# Patient Record
Sex: Female | Born: 1980 | Race: Black or African American | Hispanic: No | Marital: Married | Smoking: Never smoker
Health system: Southern US, Community
[De-identification: ages and names within clinical notes are randomized; demographics above are authoritative.]

## PROBLEM LIST (undated history)

## (undated) DIAGNOSIS — D509 Iron deficiency anemia, unspecified: Secondary | ICD-10-CM

## (undated) DIAGNOSIS — G43909 Migraine, unspecified, not intractable, without status migrainosus: Secondary | ICD-10-CM

## (undated) DIAGNOSIS — N200 Calculus of kidney: Secondary | ICD-10-CM

## (undated) DIAGNOSIS — K859 Acute pancreatitis without necrosis or infection, unspecified: Secondary | ICD-10-CM

## (undated) DIAGNOSIS — K219 Gastro-esophageal reflux disease without esophagitis: Secondary | ICD-10-CM

## (undated) DIAGNOSIS — O4400 Placenta previa specified as without hemorrhage, unspecified trimester: Secondary | ICD-10-CM

## (undated) DIAGNOSIS — F419 Anxiety disorder, unspecified: Secondary | ICD-10-CM

## (undated) DIAGNOSIS — O4590 Premature separation of placenta, unspecified, unspecified trimester: Secondary | ICD-10-CM

## (undated) DIAGNOSIS — E559 Vitamin D deficiency, unspecified: Secondary | ICD-10-CM

## (undated) DIAGNOSIS — Z5189 Encounter for other specified aftercare: Secondary | ICD-10-CM

## (undated) DIAGNOSIS — F32A Depression, unspecified: Secondary | ICD-10-CM

## (undated) DIAGNOSIS — D6859 Other primary thrombophilia: Secondary | ICD-10-CM

## (undated) DIAGNOSIS — K589 Irritable bowel syndrome without diarrhea: Secondary | ICD-10-CM

## (undated) HISTORY — DX: Migraine, unspecified, not intractable, without status migrainosus: G43.909

## (undated) HISTORY — DX: Anxiety disorder, unspecified: F41.9

## (undated) HISTORY — DX: Encounter for other specified aftercare: Z51.89

## (undated) HISTORY — DX: Iron deficiency anemia, unspecified: D50.9

## (undated) HISTORY — DX: Complete placenta previa nos or without hemorrhage, unspecified trimester: O44.00

## (undated) HISTORY — DX: Depression, unspecified: F32.A

## (undated) HISTORY — DX: Vitamin D deficiency, unspecified: E55.9

## (undated) HISTORY — DX: Calculus of kidney: N20.0

## (undated) HISTORY — DX: Other primary thrombophilia: D68.59

---
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
## 2005-11-01 DIAGNOSIS — D6859 Other primary thrombophilia: Secondary | ICD-10-CM | POA: Insufficient documentation

## 2013-04-16 DIAGNOSIS — Z0184 Encounter for antibody response examination: Secondary | ICD-10-CM

## 2013-04-16 HISTORY — DX: Encounter for antibody response examination: Z01.84

## 2016-06-27 DIAGNOSIS — D509 Iron deficiency anemia, unspecified: Secondary | ICD-10-CM | POA: Insufficient documentation

## 2017-04-03 ENCOUNTER — Encounter (HOSPITAL_COMMUNITY): Payer: Self-pay | Admitting: Family Medicine

## 2017-04-03 DIAGNOSIS — R945 Abnormal results of liver function studies: Secondary | ICD-10-CM | POA: Diagnosis not present

## 2017-04-03 DIAGNOSIS — R1013 Epigastric pain: Secondary | ICD-10-CM | POA: Insufficient documentation

## 2017-04-03 LAB — CBC
HCT: 36.2 % (ref 36.0–46.0)
Hemoglobin: 12 g/dL (ref 12.0–15.0)
MCH: 28 pg (ref 26.0–34.0)
MCHC: 33.1 g/dL (ref 30.0–36.0)
MCV: 84.4 fL (ref 78.0–100.0)
PLATELETS: 290 10*3/uL (ref 150–400)
RBC: 4.29 MIL/uL (ref 3.87–5.11)
RDW: 12.5 % (ref 11.5–15.5)
WBC: 13.2 10*3/uL — AB (ref 4.0–10.5)

## 2017-04-03 LAB — COMPREHENSIVE METABOLIC PANEL
ALT: 69 U/L — AB (ref 14–54)
AST: 129 U/L — AB (ref 15–41)
Albumin: 4.2 g/dL (ref 3.5–5.0)
Alkaline Phosphatase: 73 U/L (ref 38–126)
Anion gap: 6 (ref 5–15)
BILIRUBIN TOTAL: 0.5 mg/dL (ref 0.3–1.2)
BUN: 20 mg/dL (ref 6–20)
CO2: 27 mmol/L (ref 22–32)
CREATININE: 0.98 mg/dL (ref 0.44–1.00)
Calcium: 8.9 mg/dL (ref 8.9–10.3)
Chloride: 106 mmol/L (ref 101–111)
Glucose, Bld: 178 mg/dL — ABNORMAL HIGH (ref 65–99)
POTASSIUM: 3.2 mmol/L — AB (ref 3.5–5.1)
Sodium: 139 mmol/L (ref 135–145)
TOTAL PROTEIN: 6.9 g/dL (ref 6.5–8.1)

## 2017-04-03 LAB — LIPASE, BLOOD: LIPASE: 34 U/L (ref 11–51)

## 2017-04-03 MED ORDER — ONDANSETRON 4 MG PO TBDP
4.0000 mg | ORAL_TABLET | Freq: Once | ORAL | Status: AC | PRN
  Administered 2017-04-03: 4 mg via ORAL
  Filled 2017-04-03: qty 1

## 2017-04-03 NOTE — ED Triage Notes (Signed)
Patient is from home and complaining of upper abd pain that started about 45 minutes ago while eating. While enroute, patient had nausea and vomiting. Patient reported to EMS she had a normal BM earlier today.

## 2017-04-04 ENCOUNTER — Emergency Department (HOSPITAL_COMMUNITY): Payer: BLUE CROSS/BLUE SHIELD

## 2017-04-04 ENCOUNTER — Encounter (HOSPITAL_COMMUNITY): Payer: Self-pay

## 2017-04-04 ENCOUNTER — Emergency Department (HOSPITAL_COMMUNITY)
Admission: EM | Admit: 2017-04-04 | Discharge: 2017-04-04 | Disposition: A | Discharge status: Home / Self Care | Payer: BLUE CROSS/BLUE SHIELD | Attending: Emergency Medicine | Admitting: Emergency Medicine

## 2017-04-04 DIAGNOSIS — R1013 Epigastric pain: Secondary | ICD-10-CM

## 2017-04-04 DIAGNOSIS — R748 Abnormal levels of other serum enzymes: Secondary | ICD-10-CM

## 2017-04-04 HISTORY — DX: Irritable bowel syndrome, unspecified: K58.9

## 2017-04-04 HISTORY — DX: Acute pancreatitis without necrosis or infection, unspecified: K85.90

## 2017-04-04 HISTORY — DX: Gastro-esophageal reflux disease without esophagitis: K21.9

## 2017-04-04 LAB — URINALYSIS, ROUTINE W REFLEX MICROSCOPIC
BILIRUBIN URINE: NEGATIVE
Bacteria, UA: NONE SEEN
GLUCOSE, UA: NEGATIVE mg/dL
KETONES UR: 5 mg/dL — AB
LEUKOCYTES UA: NEGATIVE
Nitrite: NEGATIVE
PH: 5 (ref 5.0–8.0)
Protein, ur: NEGATIVE mg/dL
Specific Gravity, Urine: 1.032 — ABNORMAL HIGH (ref 1.005–1.030)

## 2017-04-04 LAB — POC URINE PREG, ED: PREG TEST UR: NEGATIVE

## 2017-04-04 MED ORDER — SODIUM CHLORIDE 0.9 % IV BOLUS (SEPSIS)
500.0000 mL | Freq: Once | INTRAVENOUS | Status: AC
  Administered 2017-04-04: 500 mL via INTRAVENOUS

## 2017-04-04 MED ORDER — IOPAMIDOL (ISOVUE-300) INJECTION 61%
100.0000 mL | Freq: Once | INTRAVENOUS | Status: AC | PRN
Start: 2017-04-04 — End: 2017-04-04
  Administered 2017-04-04: 100 mL via INTRAVENOUS

## 2017-04-04 MED ORDER — SODIUM CHLORIDE 0.9 % IV BOLUS (SEPSIS)
1000.0000 mL | Freq: Once | INTRAVENOUS | Status: AC
  Administered 2017-04-04: 1000 mL via INTRAVENOUS

## 2017-04-04 MED ORDER — IOPAMIDOL (ISOVUE-300) INJECTION 61%
INTRAVENOUS | Status: AC
  Filled 2017-04-04: qty 100

## 2017-04-04 MED ORDER — OMEPRAZOLE 20 MG PO CPDR: DELAYED_RELEASE_CAPSULE | ORAL | 0 refills | Status: DC

## 2017-04-04 MED ORDER — FENTANYL CITRATE (PF) 100 MCG/2ML IJ SOLN
50.0000 ug | Freq: Once | INTRAMUSCULAR | Status: AC
  Administered 2017-04-04: 50 ug via INTRAVENOUS
  Filled 2017-04-04: qty 2

## 2017-04-04 NOTE — Discharge Instructions (Signed)
Avoid milk products or anything that makes her pain worse. Take the Prilosec until you can be evaluated by your gastroenterologist. Please call his office and tell him he came to the ED and he had some elevation of your liver tests.

## 2017-04-04 NOTE — ED Provider Notes (Signed)
[facility] DEPT Provider Note   CSN: [id number] Arrival date & time: 04/03/17  2116  Time seen 03:30 AM   History   Chief Complaint Chief Complaint  Patient presents with  . Abdominal Pain    HPI Jennifer Summers is a 36 y.o. female.  HPI  patient states she started getting severe upper abdominal pain about 8 PM this evening. She states it is burning but feels different from when she had reflux. She states it's similar to when she had gastritis but worse. The pain does not radiate. She states if she touches her abdomen or she moves it makes the pain worse. Nothing makes it feel better. She had nausea on the way to the ED and was given Zofran which has helped. She states she did take Goody's this morning and then again at 2 PM. She took it for pain in her legs. She states she doesn't normally take it. She states after she got this discomfort she took a gas pill without relief. She states she had abdominal bloating earlier but not now. She denies any fever. She states her mother has a history of IBS. She does report over the past week she changed her diet and she has gone to a low carbohydrate high fat diet.  PCP Dr Inda Castle   Past Medical History:  Diagnosis Date  . Acid reflux   . IBS (irritable bowel syndrome)   . Pancreatitis     There are no active problems to display for this patient.   Past Surgical History:  Procedure Laterality Date  . CESAREAN SECTION     x 4.     OB History    No data available       Home Medications    Prior to Admission medications   Medication Sig Start Date End Date Taking? Authorizing Provider  Multiple Vitamin (MULTIVITAMIN WITH MINERALS) TABS tablet Take 1 tablet by mouth daily.   Yes [provider]  omeprazole (PRILOSEC) 20 MG capsule Take 1 po BID x 2 weeks then once a day 04/04/17   Rolland Porter, MD    Family History History reviewed. No pertinent family history.  Social History Social History  Substance Use Topics    . Smoking status: Never Smoker  . Smokeless tobacco: Never Used  . Alcohol use No  employed   Allergies   Codeine   Review of Systems Review of Systems  All other systems reviewed and are negative.    Physical Exam Updated Vital Signs BP 106/64   Pulse 88   Temp (!) 97.5 F (36.4 C) (Oral)   Resp 18   Ht 5\' 3"  (1.6 m)   Wt 64.9 kg (143 lb)   LMP 03/30/2017   SpO2 98%   BMI 25.33 kg/m   Vital signs normal    Physical Exam  Constitutional: She is oriented to person, place, and time. She appears well-developed and well-nourished.  Non-toxic appearance. She does not appear ill. No distress.  HENT:  Head: Normocephalic and atraumatic.  Right Ear: External ear normal.  Left Ear: External ear normal.  Nose: Nose normal. No mucosal edema or rhinorrhea.  Mouth/Throat: Oropharynx is clear and moist and mucous membranes are normal. No dental abscesses or uvula swelling.  Eyes: Pupils are equal, round, and reactive to light. Conjunctivae and EOM are normal.  Neck: Normal range of motion and full passive range of motion without pain. Neck supple.  Cardiovascular: Normal rate, regular rhythm and normal heart sounds.  Exam  reveals no gallop and no friction rub.   No murmur heard. Pulmonary/Chest: Effort normal and breath sounds normal. No respiratory distress. She has no wheezes. She has no rhonchi. She has no rales. She exhibits no tenderness and no crepitus.  Abdominal: Soft. Normal appearance and bowel sounds are normal. She exhibits no distension. There is no tenderness. There is no rebound and no guarding.  Musculoskeletal: Normal range of motion. She exhibits no edema or tenderness.  Moves all extremities well.   Neurological: She is alert and oriented to person, place, and time. She has normal strength. No cranial nerve deficit.  Skin: Skin is warm, dry and intact. No rash noted. No erythema. No pallor.  Psychiatric: She has a normal mood and affect. Her speech is normal  and behavior is normal. Her mood appears not anxious.  Nursing note and vitals reviewed.    ED Treatments / Results  Labs (all labs ordered are listed, but only abnormal results are displayed) Results for orders placed or performed during the hospital encounter of 04/04/17  Lipase, blood  Result Value Ref Range   Lipase 34 11 - 51 U/L  Comprehensive metabolic panel  Result Value Ref Range   Sodium 139 135 - 145 mmol/L   Potassium 3.2 (L) 3.5 - 5.1 mmol/L   Chloride 106 101 - 111 mmol/L   CO2 27 22 - 32 mmol/L   Glucose, Bld 178 (H) 65 - 99 mg/dL   BUN 20 6 - 20 mg/dL   Creatinine, Ser 0.98 0.44 - 1.00 mg/dL   Calcium 8.9 8.9 - 10.3 mg/dL   Total Protein 6.9 6.5 - 8.1 g/dL   Albumin 4.2 3.5 - 5.0 g/dL   AST 129 (H) 15 - 41 U/L   ALT 69 (H) 14 - 54 U/L   Alkaline Phosphatase 73 38 - 126 U/L   Total Bilirubin 0.5 0.3 - 1.2 mg/dL   GFR calc non Af Amer >60 >60 mL/min   GFR calc Af Amer >60 >60 mL/min   Anion gap 6 5 - 15  CBC  Result Value Ref Range   WBC 13.2 (H) 4.0 - 10.5 K/uL   RBC 4.29 3.87 - 5.11 MIL/uL   Hemoglobin 12.0 12.0 - 15.0 g/dL   HCT 36.2 36.0 - 46.0 %   MCV 84.4 78.0 - 100.0 fL   MCH 28.0 26.0 - 34.0 pg   MCHC 33.1 30.0 - 36.0 g/dL   RDW 12.5 11.5 - 15.5 %   Platelets 290 150 - 400 K/uL  Urinalysis, Routine w reflex microscopic  Result Value Ref Range   Color, Urine YELLOW YELLOW   APPearance CLEAR CLEAR   Specific Gravity, Urine 1.032 (H) 1.005 - 1.030   pH 5.0 5.0 - 8.0   Glucose, UA NEGATIVE NEGATIVE mg/dL   Hgb urine dipstick SMALL (A) NEGATIVE   Bilirubin Urine NEGATIVE NEGATIVE   Ketones, ur 5 (A) NEGATIVE mg/dL   Protein, ur NEGATIVE NEGATIVE mg/dL   Nitrite NEGATIVE NEGATIVE   Leukocytes, UA NEGATIVE NEGATIVE   RBC / HPF 0-5 0 - 5 RBC/hpf   WBC, UA 6-30 0 - 5 WBC/hpf   Bacteria, UA NONE SEEN NONE SEEN   Squamous Epithelial / LPF 0-5 (A) NONE SEEN   Mucus PRESENT   POC urine preg, ED  Result Value Ref Range   Preg Test, Ur NEGATIVE  NEGATIVE   Laboratory interpretation all normal except Leukocytosis, hyperglycemia, mild hypokalemia, elevation of LFTs with normal lipase    EKG  EKG Interpretation None       Radiology Ct Abdomen Pelvis W Contrast  Result Date: 04/04/2017 CLINICAL DATA:  Right-sided abdominal pain for several hours EXAM: CT ABDOMEN AND PELVIS WITH CONTRAST TECHNIQUE: Multidetector CT imaging of the abdomen and pelvis was performed using the standard protocol following bolus administration of intravenous contrast. CONTRAST:  1108mL ISOVUE-300 IOPAMIDOL (ISOVUE-300) INJECTION 61% COMPARISON:  Ultrasound from earlier in the same day. FINDINGS: Lower chest: No acute abnormality. Hepatobiliary: The liver shows no focal mass lesion or biliary ductal dilatation. Some very mild perivascular edema is noted which may represent some underlying hepatic inflammatory change. The gallbladder is within normal limits. Pancreas: Unremarkable. No pancreatic ductal dilatation or surrounding inflammatory changes. Spleen: Normal in size without focal abnormality. Adrenals/Urinary Tract: Adrenal glands are unremarkable. Kidneys are normal, without renal calculi, focal lesion, or hydronephrosis. Bladder is unremarkable. Stomach/Bowel: Stomach is decompressed. No significant obstructive or inflammatory changes are seen. The appendix is within normal limits without inflammatory change. Vascular/Lymphatic: No significant vascular findings are present. No enlarged abdominal or pelvic lymph nodes. Reproductive: Uterus and bilateral adnexa are unremarkable. Other: Minimal free fluid is noted likely physiologic in nature. Musculoskeletal: No acute or significant osseous findings. IMPRESSION: Mild perivascular edema is noted within the liver or likely representing some underlying hepatic inflammatory change. No other focal abnormality is noted. Electronically Signed   By: Inez Catalina M.D.   On: 04/04/2017 07:29   US Abdomen Limited  Ruq  Result Date: 04/04/2017 CLINICAL DATA:  Epigastric abdominal pain.  Elevated LFTs. EXAM: ULTRASOUND ABDOMEN LIMITED RIGHT UPPER QUADRANT COMPARISON:  None. FINDINGS: Gallbladder: Physiologically distended. No gallstones or wall thickening visualized. No sonographic Murphy sign noted by sonographer. Common bile duct: Diameter: 4 mm. Liver: No focal lesion identified. Within normal limits in parenchymal echogenicity. Portal vein is patent on color Doppler imaging with normal direction of blood flow towards the liver. IMPRESSION: Unremarkable right upper quadrant ultrasound. Electronically Signed   By: Jeb Levering M.D.   On: 04/04/2017 06:11    Procedures Procedures (including critical care time)  Medications Ordered in ED Medications  iopamidol (ISOVUE-300) 61 % injection (not administered)  ondansetron (ZOFRAN-ODT) disintegrating tablet 4 mg (4 mg Oral Given 04/03/17 2140)  sodium chloride 0.9 % bolus 1,000 mL (0 mLs Intravenous Stopped 04/04/17 0707)  sodium chloride 0.9 % bolus 500 mL (0 mLs Intravenous Stopped 04/04/17 0707)  fentaNYL (SUBLIMAZE) injection 50 mcg (50 mcg Intravenous Given 04/04/17 0410)  iopamidol (ISOVUE-300) 61 % injection 100 mL (100 mLs Intravenous Contrast Given 04/04/17 0702)  sodium chloride 0.9 % bolus 1,000 mL (1,000 mLs Intravenous New Bag/Given 04/04/17 0755)     Initial Impression / Assessment and Plan / ED Course  I have reviewed the triage vital signs and the nursing notes.  Pertinent labs & imaging results that were available during my care of the patient were reviewed by me and considered in my medical decision making (see chart for details).    Patient's pain is suspicious for gallstones or gallbladder disease. She was given IV fluids IV pain and nausea medication. Ultrasound the right upper quadrant was ordered.  Recheck at 6:10 AM reviewed her ultrasound report which did not show any obvious gallbladder disease. She states her pain is improved. We  did proceed with a CT of her abdomen to look for other underlying etiologies of her pain such as colitis.  Recheck at 8 AM patient reports she sees a gastroenterologist, Dr. Adele Barthel, gastroenterology, had a endoscopy and colonoscopy recently.  She states "I've always had stomach issues". She states she's had pancreatitis and GERD in the past. She currently is not on any medications for her GERD. She denies being on any medications and denies taking any over-the-counter pain medications other than the Goody's she took earlier today. She denies drinking alcohol. Hepatitis panel was ordered and patient is going to be referred back to her gastroenterologist. I'm going to put her back on Prilosec.  Final Clinical Impressions(s) / ED Diagnoses   Final diagnoses:  Epigastric abdominal pain  Elevated liver enzymes    New Prescriptions New Prescriptions   OMEPRAZOLE (PRILOSEC) 20 MG CAPSULE    Take 1 po BID x 2 weeks then once a day    Plan discharge  Rolland Porter, MD, Barbette Or, MD 04/04/17 [phone]

## 2017-04-04 NOTE — ED Notes (Signed)
Phlebotomy called and to bedside.

## 2017-04-04 NOTE — ED Notes (Signed)
Patient states nausea has improved since ODT Zofran

## 2017-04-04 NOTE — ED Notes (Signed)
RN and NT attempted to draw blood. Unsuccessful at this time. Another staff member requested to help.

## 2017-04-04 NOTE — ED Notes (Signed)
Pt aware that a urine sample is needed but is unable to urinate at this time.

## 2017-04-04 NOTE — ED Notes (Signed)
Phlebotomy unable to get labs. Per Dr. Zenia Resides and Dr. Rogene Houston, patient okay to be discharged without labs being drawn.

## 2017-04-04 NOTE — ED Notes (Signed)
Pt unable to urinate at this time; she stated that she would try shortly. Also, pt stated that she forgot to mention a previous dx of gastritis. The symptoms she is having is similar to that when she had gastritis.

## 2017-04-04 NOTE — ED Notes (Signed)
This writer attempt blood draw to left hand unsuccessful. Kelly from phlebotomy verbalizes will attempt lab draw. Chelsea primary RN made aware.

## 2017-04-17 DIAGNOSIS — R7989 Other specified abnormal findings of blood chemistry: Secondary | ICD-10-CM | POA: Insufficient documentation

## 2017-04-17 DIAGNOSIS — R1011 Right upper quadrant pain: Secondary | ICD-10-CM

## 2017-04-17 HISTORY — DX: Right upper quadrant pain: R10.11

## 2017-09-14 DIAGNOSIS — M5090 Cervical disc disorder, unspecified, unspecified cervical region: Secondary | ICD-10-CM

## 2017-09-14 HISTORY — DX: Cervical disc disorder, unspecified, unspecified cervical region: M50.90

## 2018-08-23 LAB — IRON,TIBC AND FERRITIN PANEL: Ferritin: 9

## 2018-08-23 LAB — HEPATIC FUNCTION PANEL
ALT: 23 (ref 7–35)
AST: 21 (ref 13–35)

## 2018-08-23 LAB — LIPID PANEL
Cholesterol: 209 — AB (ref 0–200)
HDL: 111 — AB (ref 35–70)
LDL Cholesterol: 90
Triglycerides: 41 (ref 40–160)

## 2018-08-23 LAB — CBC AND DIFFERENTIAL
HCT: 33 — AB (ref 36–46)
Hemoglobin: 9.1 — AB (ref 12.0–16.0)
Platelets: 493 — AB (ref 150–399)
WBC: 5.1

## 2018-08-23 LAB — BASIC METABOLIC PANEL
BUN: 6 (ref 4–21)
Creatinine: 1.2 — AB (ref 0.5–1.1)

## 2018-08-23 LAB — VITAMIN B12: Vitamin B-12: 1116

## 2018-08-23 LAB — TSH: TSH: 1.17 (ref 0.41–5.90)

## 2018-08-23 LAB — VITAMIN D 25 HYDROXY (VIT D DEFICIENCY, FRACTURES): Vit D, 25-Hydroxy: 14.35

## 2018-11-08 DIAGNOSIS — R7303 Prediabetes: Secondary | ICD-10-CM

## 2018-11-08 HISTORY — DX: Prediabetes: R73.03

## 2019-01-13 HISTORY — PX: OTHER SURGICAL HISTORY: SHX169

## 2019-08-25 ENCOUNTER — Ambulatory Visit (INDEPENDENT_AMBULATORY_CARE_PROVIDER_SITE_OTHER): Payer: No Typology Code available for payment source | Admitting: Family Medicine

## 2019-08-25 ENCOUNTER — Encounter: Payer: Self-pay | Admitting: Family Medicine

## 2019-08-25 ENCOUNTER — Other Ambulatory Visit: Payer: Self-pay

## 2019-08-25 VITALS — BP 116/75 | HR 77 | Temp 98.5°F | Resp 18 | Ht 64.0 in | Wt 147.7 lb

## 2019-08-25 DIAGNOSIS — N912 Amenorrhea, unspecified: Secondary | ICD-10-CM | POA: Diagnosis not present

## 2019-08-25 DIAGNOSIS — Z7689 Persons encountering health services in other specified circumstances: Secondary | ICD-10-CM | POA: Diagnosis not present

## 2019-08-25 DIAGNOSIS — K219 Gastro-esophageal reflux disease without esophagitis: Secondary | ICD-10-CM | POA: Diagnosis not present

## 2019-08-25 DIAGNOSIS — F419 Anxiety disorder, unspecified: Secondary | ICD-10-CM | POA: Diagnosis not present

## 2019-08-25 MED ORDER — PROPRANOLOL HCL 10 MG PO TABS: 10.0000 mg | ORAL_TABLET | Freq: Three times a day (TID) | ORAL | 1 refills | Status: DC

## 2019-08-25 MED ORDER — VENLAFAXINE HCL ER 37.5 MG PO CP24: ORAL_CAPSULE | ORAL | 0 refills | Status: DC

## 2019-08-25 MED ORDER — HYDROXYZINE PAMOATE 25 MG PO CAPS: 25.0000 mg | ORAL_CAPSULE | Freq: Every day | ORAL | 0 refills | Status: DC

## 2019-08-25 MED ORDER — OMEPRAZOLE 20 MG PO CPDR: DELAYED_RELEASE_CAPSULE | ORAL | 1 refills | Status: DC

## 2019-08-25 NOTE — Patient Instructions (Addendum)
We will call you with lab results.  Start effexor 37.5 mg for 1 week, then increase to 75 mg qd (in the morning). After 1 week on the 75 mg dose>> stop Wellbutrin.   Propanolol can be taken every 8 hours. Take before work at least temporarily.   Vistaril prescribed for before bed.   Refilled Prilosec for you.   Follow up in 3.5 weeks on anxiety    Panic Attack  A panic attack is when you suddenly feel very afraid, uncomfortable, or nervous (anxious). A panic attack can happen when you are scared or for no reason. A panic attack can feel like a serious problem. It can even feel like a heart attack or stroke. See your doctor when you have a panic attack to make sure you do not have a serious problem. Follow these instructions at home:  Take medicines only as told by your doctor.  If you feel worried or nervous, try not to have caffeine.  Take good care of your health. To do this: ? Eat healthy. Make sure to eat fresh fruits and vegetables, whole grains, lean meats, and low-fat dairy. ? Get enough sleep. Try to sleep for 7-8 hours each night. ? Exercise. Try to be active for 30 minutes 5 or more days a week. ? Do not smoke. Talk to your doctor if you need help quitting. ? Limit how much alcohol you drink:  If you are a woman who is not pregnant: try not to have more than 1 drink a day.  If you are a man: try not to have more than 2 drinks a day.  One drink equals 12 oz of beer, 5 oz of wine, or 1 oz of hard liquor.  Keep all follow-up visits as told by your doctor. This is important. Contact a doctor if:  Your symptoms do not get better.  Your symptoms get worse.  You are not able to take your medicines as told. Get help right away if:  You have thoughts of hurting yourself or others.  You have symptoms of a panic attack. Do not drive yourself to the hospital. Have someone else drive you or call an ambulance. If you feel like you may hurt yourself or others, or have  thoughts about taking your own life, get help right away. You can go to your nearest emergency department or call:  Your local emergency services (911 in the [place]  A suicide crisis helpline, such as the [facility] at 2057521094. This is open 24 hours a day. Summary  A panic attack is when you suddenly feel very afraid, uncomfortable, or nervous (anxious).  See your doctor when you have a panic attack to make sure that you do not have another serious problem.  If you feel like you may hurt yourself or others, get help right away by calling 911. This information is not intended to replace advice given to you by your health care provider. Make sure you discuss any questions you have with your health care provider. Document Revised: 07/13/2017 Document Reviewed: 09/13/2016 Elsevier Patient Education  2020 [employer].

## 2019-08-25 NOTE — Progress Notes (Signed)
Patient ID: Jennifer Summers, female  DOB: 05/06/1981, 39 y.o.   MRN: [id number] Patient Care Team    Relationship Specialty Notifications Start End  Ma Hillock, DO PCP - General Family Medicine  08/25/19     Chief Complaint  Patient presents with  . Establish Care  . Anxiety  . Lmp    November 2020    Subjective:  Jennifer Summers is a 39 y.o.  female present for new patient establishment. All past medical history, surgical history, allergies, family history, immunizations, medications and social history were updated in the electronic medical record today. All recent labs, ED visits and hospitalizations within the last year were reviewed.  Anxiety: Patient reports she has suffered from anxiety for some time.  She has been on Wellbutrin 100 mg daily for approximately 1 year.  She states she was also started on Xanax at that time but it made her too sleepy and she cannot take that medication and function throughout her day.  In the past she is also been tried on Zoloft which did not help for her at all and Vistaril which helped her sleep only.  Adderall also made her too sleepy and was not helpful during the day.  She reports she has increased anxiety, and feels like she wants to get out of the room or run at times.  She states this mostly occurs when she is at work.  She is uncertain why since she has started a new job which she enjoys.  Amenorrhea: Patient reports her last menstrual period was November 2020.  She underwent a tubal reversal over the summer.  She and her husband are desiring to have a child.  She has taken multiple pregnancy tests, last this morning and all have been negative.  She denies nausea, vomit or breast tenderness.  Depression screen PHQ 2/9 08/25/2019  Decreased Interest 0  Down, Depressed, Hopeless 0  PHQ - 2 Score 0     Fall Risk  08/25/2019  Falls in the past year? 0    There is no immunization history on file for this patient.  No exam data  present  Past Medical History:  Diagnosis Date  . Acid reflux   . Anemia   . Anxiety   . IBS (irritable bowel syndrome)   . Iron deficiency   . Pancreatitis   . Protein S deficiency ([place])    Allergies  Allergen Reactions  . Codeine Other (See Comments)    unknown   Past Surgical History:  Procedure Laterality Date  . CESAREAN SECTION     x 4.    Family History  Problem Relation Age of Onset  . Hypertension Mother    Social History   Social History Narrative  . Not on file    Allergies as of 08/25/2019      Reactions   Codeine Other (See Comments)   unknown      Medication List       Accurate as of August 25, 2019 11:59 PM. If you have any questions, ask your nurse or doctor.        STOP taking these medications   dicyclomine 10 MG capsule Commonly known as: BENTYL Stopped by: Howard Pouch, DO   hydrOXYzine 10 MG tablet Commonly known as: ATARAX/VISTARIL Stopped by: Howard Pouch, DO   phentermine 37.5 MG tablet Commonly known as: ADIPEX-P Stopped by: Howard Pouch, DO   Xanax 0.5 MG tablet Generic drug: ALPRAZolam Stopped by: Howard Pouch, DO  TAKE these medications   buPROPion 100 MG tablet Commonly known as: WELLBUTRIN   hydrOXYzine 25 MG capsule Commonly known as: Vistaril Take 1 capsule (25 mg total) by mouth at bedtime. Started by: Howard Pouch, DO   multivitamin with minerals Tabs tablet Take 1 tablet by mouth daily.   omeprazole 20 MG capsule Commonly known as: PRILOSEC Take 1 po BID x 2 weeks then once a day   propranolol 10 MG tablet Commonly known as: INDERAL Take 1 tablet (10 mg total) by mouth 3 (three) times daily. Started by: Howard Pouch, DO   venlafaxine XR 37.5 MG 24 hr capsule Commonly known as: Effexor XR Take 1 capsule (37.5 mg total) by mouth daily with breakfast for 7 days, THEN 2 capsules (75 mg total) daily with breakfast for 23 days. Start taking on: August 25, 2019 Started by: Howard Pouch, DO        All past medical history, surgical history, allergies, family history, immunizations andmedications were updated in the EMR today and reviewed under the history and medication portions of their EMR.    Recent Results (from the past 2160 hour(s))  TSH     Status: None   Collection Time: 08/25/19  3:41 PM  Result Value Ref Range   TSH 1.12 mIU/L    Comment:           Reference Range .           > or = 20 Years  0.40-4.50 .                Pregnancy Ranges           First trimester    0.26-2.66           Second trimester   0.55-2.73           Third trimester    0.43-2.91   T3, free     Status: None   Collection Time: 08/25/19  3:41 PM  Result Value Ref Range   T3, Free 2.6 2.3 - 4.2 pg/mL  T4, free     Status: None   Collection Time: 08/25/19  3:41 PM  Result Value Ref Range   Free T4 1.0 0.8 - 1.8 ng/dL  hCG, serum, qualitative     Status: None   Collection Time: 08/25/19  3:41 PM  Result Value Ref Range   Preg, Serum NEGATIVE     Comment: Reference Range Non-Pregnant: Negative Pregnant:     Positive .      ROS: 14 pt review of systems performed and negative (unless mentioned in an HPI)  Objective: BP 116/75 (BP Location: Left Arm, Patient Position: Sitting, Cuff Size: Normal)   Pulse 77   Temp 98.5 F (36.9 C) (Temporal)   Resp 18   Ht 5\' 4"  (1.626 m)   Wt 147 lb 11.2 oz (67 kg)   SpO2 100%   BMI 25.35 kg/m  Gen: Afebrile. No acute distress. Nontoxic in appearance, well-developed, well-nourished, does not female. HENT: AT. [place].  Eyes:Pupils Equal Round Reactive to light, Extraocular movements intact,  Conjunctiva without redness, discharge or icterus. Neck/lymp/endocrine: Supple, no lymphadenopathy, no thyromegaly CV: RRR no murmur, no edema Chest: CTAB, no wheeze, rhonchi or crackles. . Neuro/Msk:  Normal gait. PERLA. EOMi. Alert. Oriented x3.   Psych: Normal affect, dress and demeanor. Normal speech. Normal thought content and judgment.   Assessment/plan:  Jennifer Summers is a 39 y.o. female present for est care Chronic GERD Stable.  Refilled Prilosec.  Amenorrhea Encouraged her to establish with a gynecologist for further evaluation if pregnancy test and thyroid panel did not show cause of amenorrhea. - TSH - T3, free - T4, free - hCG, serum, qualitative  Anxiety Discussed options with her today.  We will collect lab work to rule out thyroid disorder as potential cause. -For the short-term we will add propranolol 10 mg every 8 hours as needed.  If she does become pregnant would discourage use.  Hopefully will be used temporarily while other medications are started. -Start Effexor 37.5 mg daily for 1 week, then increase to 75 mg daily.  After 1 week of 75 mg of Effexor, then stop the Wellbutrin. -Vistaril prescribed nightly as needed. - TSH - T3, free - T4, free Follow-up 3.5 weeks.  No follow-ups on file. Orders Placed This Encounter  Procedures  . TSH  . T3, free  . T4, free  . hCG, serum, qualitative   Meds ordered this encounter  Medications  . omeprazole (PRILOSEC) 20 MG capsule    Sig: Take 1 po BID x 2 weeks then once a day    Dispense:  180 capsule    Refill:  1  . hydrOXYzine (VISTARIL) 25 MG capsule    Sig: Take 1 capsule (25 mg total) by mouth at bedtime.    Dispense:  30 capsule    Refill:  0  . propranolol (INDERAL) 10 MG tablet    Sig: Take 1 tablet (10 mg total) by mouth 3 (three) times daily.    Dispense:  90 tablet    Refill:  1  . venlafaxine XR (EFFEXOR XR) 37.5 MG 24 hr capsule    Sig: Take 1 capsule (37.5 mg total) by mouth daily with breakfast for 7 days, THEN 2 capsules (75 mg total) daily with breakfast for 23 days.    Dispense:  53 capsule    Refill:  0     Note is dictated utilizing voice recognition software. Although note has been proof read prior to signing, occasional typographical errors still can be missed. If any questions arise, please do not hesitate to call for verification.   Electronically signed by: Howard Pouch, DO [place]

## 2019-08-26 LAB — HCG, SERUM, QUALITATIVE: Preg, Serum: NEGATIVE

## 2019-08-26 LAB — TSH: TSH: 1.12 mIU/L

## 2019-08-26 LAB — T4, FREE: Free T4: 1 ng/dL (ref 0.8–1.8)

## 2019-08-26 LAB — T3, FREE: T3, Free: 2.6 pg/mL (ref 2.3–4.2)

## 2019-08-28 ENCOUNTER — Encounter: Payer: Self-pay | Admitting: Family Medicine

## 2019-08-28 DIAGNOSIS — F32A Depression, unspecified: Secondary | ICD-10-CM | POA: Insufficient documentation

## 2019-08-28 DIAGNOSIS — F419 Anxiety disorder, unspecified: Secondary | ICD-10-CM | POA: Insufficient documentation

## 2019-09-19 ENCOUNTER — Ambulatory Visit (INDEPENDENT_AMBULATORY_CARE_PROVIDER_SITE_OTHER): Payer: No Typology Code available for payment source | Admitting: Family Medicine

## 2019-09-19 ENCOUNTER — Other Ambulatory Visit: Payer: Self-pay

## 2019-09-19 ENCOUNTER — Encounter: Payer: Self-pay | Admitting: Family Medicine

## 2019-09-19 VITALS — Ht 64.0 in

## 2019-09-19 DIAGNOSIS — F419 Anxiety disorder, unspecified: Secondary | ICD-10-CM | POA: Diagnosis not present

## 2019-09-19 MED ORDER — PROPRANOLOL HCL 10 MG PO TABS: 10.0000 mg | ORAL_TABLET | Freq: Three times a day (TID) | ORAL | 1 refills | Status: DC

## 2019-09-19 MED ORDER — HYDROXYZINE PAMOATE 50 MG PO CAPS: 50.0000 mg | ORAL_CAPSULE | Freq: Every day | ORAL | 1 refills | Status: DC

## 2019-09-19 MED ORDER — VENLAFAXINE HCL ER 150 MG PO CP24: 150.0000 mg | ORAL_CAPSULE | Freq: Every day | ORAL | 1 refills | Status: DC

## 2019-09-19 MED FILL — VENLAFAXINE HCL ER 150 MG C: 150 | 90 days supply | Qty: 90 | Fill #0

## 2019-09-19 MED FILL — HYDROXYZINE PAM 50 MG CAP: 50 | 90 days supply | Qty: 90 | Fill #0

## 2019-09-19 MED FILL — PROPRANOLOL 10 MG TABLET: 10 | 30 days supply | Qty: 90 | Fill #0

## 2019-09-19 NOTE — Progress Notes (Signed)
Patient ID: Jennifer Summers, female  DOB: 11-29-1980, 39 y.o.   MRN: [id number] Patient Care Team    Relationship Specialty Notifications Start End  Ma Hillock, DO PCP - General Family Medicine  08/25/19     Chief Complaint  Patient presents with  . Anxiety    Pt states medication is helping "some" with the performance anxiety. Pt still does not feel her old self. Dragging and making herself do things     Subjective:  Jennifer Summers is a 39 y.o.  female present for follow up anxiety Anxiety:  Patient presents today for follow-up on her anxiety.  She has effectively tapered off the Wellbutrin and onto the Effexor 75 mg daily.  She is averaging propranolol 10 mg approximately twice a day, mostly only when at work.  She is using the Vistaril 50 mg nightly.  She reports today that her anxiety is better controlled.  She is not having the panic feelings while she is at work any longer.  She still does not feel back to 100% of her normal self.  She is tolerating the changes in medications well. Prior note:  Patient reports she has suffered from anxiety for some time.  She has been on Wellbutrin 100 mg daily for approximately 1 year.  She states she was also started on Xanax at that time but it made her too sleepy and she cannot take that medication and function throughout her day.  In the past she is also been tried on Zoloft which did not help for her at all and Vistaril which helped her sleep only.  Adderall also made her too sleepy and was not helpful during the day.  She reports she has increased anxiety, and feels like she wants to get out of the room or run at times.  She states this mostly occurs when she is at work.  She is uncertain why since she has started a new job which she enjoys.  Depression screen PHQ 2/9 08/25/2019  Decreased Interest 0  Down, Depressed, Hopeless 0  PHQ - 2 Score 0     Fall Risk  08/25/2019  Falls in the past year? 0   Immunization History    Administered Date(s) Administered  . Influenza, Seasonal, Injecte, Preservative Fre 10/28/2018    No exam data present  Past Medical History:  Diagnosis Date  . Acid reflux   . Anxiety   . IBS (irritable bowel syndrome)   . Iron deficiency anemia    Patient reports having to have iron/blood infusions in the past.  . Migraines   . Pancreatitis   . Protein S deficiency ([place])   . Right upper quadrant abdominal pain 04/17/2017   Allergies  Allergen Reactions  . Codeine Other (See Comments)    unknown   Past Surgical History:  Procedure Laterality Date  . CESAREAN SECTION     x 4.  2007, 2008, 2009, 2010.  2007 was emergent C-section.  . TONSILLECTOMY  2000  . Tubal sterilization reversal  01/2019   Family History  Problem Relation Age of Onset  . Hypertension Mother    Social History   Social History Narrative   Marital status/children/pets: Married.  4 children.   Education/employment: Some college.  Employed as an LPN/CMA   Safety:        Allergies as of 09/19/2019      Reactions   Codeine Other (See Comments)   unknown      Medication List  Accurate as of September 19, 2019  2:46 PM. If you have any questions, ask your nurse or doctor.        STOP taking these medications   buPROPion 100 MG tablet Commonly known as: WELLBUTRIN Stopped by: Howard Pouch, DO     TAKE these medications   hydrOXYzine 50 MG capsule Commonly known as: Vistaril Take 1 capsule (50 mg total) by mouth at bedtime. What changed:   medication strength  how much to take Changed by: Howard Pouch, DO   multivitamin with minerals Tabs tablet Take 1 tablet by mouth daily.   omeprazole 20 MG capsule Commonly known as: PRILOSEC Take 1 po BID x 2 weeks then once a day   propranolol 10 MG tablet Commonly known as: INDERAL Take 1 tablet (10 mg total) by mouth 3 (three) times daily.   venlafaxine XR 150 MG 24 hr capsule Commonly known as: Effexor XR Take 1 capsule (150 mg  total) by mouth daily with breakfast. What changed:   medication strength  See the new instructions. Changed by: Howard Pouch, DO       All past medical history, surgical history, allergies, family history, immunizations andmedications were updated in the EMR today and reviewed under the history and medication portions of their EMR.      ROS: 14 pt review of systems performed and negative (unless mentioned in an HPI)  Objective: Ht 5\' 4"  (1.626 m)   LMP 09/05/2019 (Exact Date)   BMI 25.35 kg/m  Gen: Afebrile. No acute distress.  HENT: AT. [place].  Neuro:  Alert. Oriented.  Psych: Normal affect, dress and demeanor. Normal speech. Normal thought content and judgment.  Assessment/plan: Jennifer Summers is a 39 y.o. female present for est care Chronic GERD stable Refilled Prilosec.  Anxiety - improving- still not as controlled.  - continue  propranolol 10 mg every 8 hours as needed.  If she does become pregnant would discourage use.  Hopefully will be used temporarily while other medications are started. - increase Effexor to 150 mg QD -Vistaril prescribed nightly as needed. Follow-up 5.5 months sooner if needed.   Return in about 6 months (around 03/08/2020). No orders of the defined types were placed in this encounter.  Meds ordered this encounter  Medications  . propranolol (INDERAL) 10 MG tablet    Sig: Take 1 tablet (10 mg total) by mouth 3 (three) times daily.    Dispense:  90 tablet    Refill:  1  . hydrOXYzine (VISTARIL) 50 MG capsule    Sig: Take 1 capsule (50 mg total) by mouth at bedtime.    Dispense:  90 capsule    Refill:  1  . venlafaxine XR (EFFEXOR XR) 150 MG 24 hr capsule    Sig: Take 1 capsule (150 mg total) by mouth daily with breakfast.    Dispense:  90 capsule    Refill:  1     Note is dictated utilizing voice recognition software. Although note has been proof read prior to signing, occasional typographical errors still can be missed. If any  questions arise, please do not hesitate to call for verification.  Electronically signed by: Howard Pouch, DO [place]

## 2019-10-01 ENCOUNTER — Encounter: Payer: Self-pay | Admitting: Family Medicine

## 2019-10-06 ENCOUNTER — Encounter: Payer: Self-pay | Admitting: Family Medicine

## 2019-10-06 DIAGNOSIS — E559 Vitamin D deficiency, unspecified: Secondary | ICD-10-CM | POA: Insufficient documentation

## 2019-11-18 ENCOUNTER — Telehealth: Payer: Self-pay

## 2019-11-18 NOTE — Telephone Encounter (Signed)
Ok with me 

## 2019-11-18 NOTE — Telephone Encounter (Signed)
Please advise of tranfer

## 2019-11-18 NOTE — Telephone Encounter (Signed)
Patient is requesting to transfer to Jennifer Summers at [place]. She works very close to that location.

## 2019-11-18 NOTE — Telephone Encounter (Signed)
Ok with me if she would like to transfer to [place] office.

## 2019-11-19 NOTE — Telephone Encounter (Signed)
Pt will call back to schedule

## 2019-11-19 NOTE — Telephone Encounter (Signed)
Can you schedule patient for a transfer of care with [facility]

## 2019-11-20 ENCOUNTER — Ambulatory Visit (INDEPENDENT_AMBULATORY_CARE_PROVIDER_SITE_OTHER): Payer: No Typology Code available for payment source | Admitting: Physician Assistant

## 2019-11-20 ENCOUNTER — Encounter: Payer: Self-pay | Admitting: Physician Assistant

## 2019-11-20 ENCOUNTER — Other Ambulatory Visit: Payer: Self-pay

## 2019-11-20 ENCOUNTER — Other Ambulatory Visit (INDEPENDENT_AMBULATORY_CARE_PROVIDER_SITE_OTHER): Payer: No Typology Code available for payment source

## 2019-11-20 VITALS — BP 110/80 | HR 70 | Temp 98.2°F | Resp 14 | Ht 64.0 in | Wt 148.0 lb

## 2019-11-20 DIAGNOSIS — K219 Gastro-esophageal reflux disease without esophagitis: Secondary | ICD-10-CM | POA: Diagnosis not present

## 2019-11-20 DIAGNOSIS — F419 Anxiety disorder, unspecified: Secondary | ICD-10-CM | POA: Diagnosis not present

## 2019-11-20 DIAGNOSIS — R5382 Chronic fatigue, unspecified: Secondary | ICD-10-CM | POA: Diagnosis not present

## 2019-11-20 DIAGNOSIS — R7989 Other specified abnormal findings of blood chemistry: Secondary | ICD-10-CM

## 2019-11-20 LAB — CBC WITH DIFFERENTIAL/PLATELET
Basophils Absolute: 0 10*3/uL (ref 0.0–0.1)
Basophils Relative: 0.5 % (ref 0.0–3.0)
Eosinophils Absolute: 0.1 10*3/uL (ref 0.0–0.7)
Eosinophils Relative: 1.4 % (ref 0.0–5.0)
HCT: 34.3 % — ABNORMAL LOW (ref 36.0–46.0)
Hemoglobin: 11 g/dL — ABNORMAL LOW (ref 12.0–15.0)
Lymphocytes Relative: 23.5 % (ref 12.0–46.0)
Lymphs Abs: 1.2 10*3/uL (ref 0.7–4.0)
MCHC: 32 g/dL (ref 30.0–36.0)
MCV: 79.6 fl (ref 78.0–100.0)
Monocytes Absolute: 0.4 10*3/uL (ref 0.1–1.0)
Monocytes Relative: 8.2 % (ref 3.0–12.0)
Neutro Abs: 3.3 10*3/uL (ref 1.4–7.7)
Neutrophils Relative %: 66.4 % (ref 43.0–77.0)
Platelets: 336 10*3/uL (ref 150.0–400.0)
RBC: 4.31 Mil/uL (ref 3.87–5.11)
RDW: 15.7 % — ABNORMAL HIGH (ref 11.5–15.5)
WBC: 5 10*3/uL (ref 4.0–10.5)

## 2019-11-20 LAB — COMPREHENSIVE METABOLIC PANEL
ALT: 13 U/L (ref 0–35)
AST: 17 U/L (ref 0–37)
Albumin: 4.2 g/dL (ref 3.5–5.2)
Alkaline Phosphatase: 64 U/L (ref 39–117)
BUN: 12 mg/dL (ref 6–23)
CO2: 27 mEq/L (ref 19–32)
Calcium: 9.2 mg/dL (ref 8.4–10.5)
Chloride: 104 mEq/L (ref 96–112)
Creatinine, Ser: 0.83 mg/dL (ref 0.40–1.20)
GFR: 92.94 mL/min (ref >60.00)
Glucose, Bld: 79 mg/dL (ref 70–99)
Potassium: 4.5 mEq/L (ref 3.5–5.1)
Sodium: 138 mEq/L (ref 135–145)
Total Bilirubin: 0.4 mg/dL (ref 0.2–1.2)
Total Protein: 6.8 g/dL (ref 6.0–8.3)

## 2019-11-20 LAB — IRON: Iron: 35 ug/dL — ABNORMAL LOW (ref 42–145)

## 2019-11-20 LAB — TSH: TSH: 0.02 u[IU]/mL — ABNORMAL LOW (ref 0.35–4.50)

## 2019-11-20 LAB — VITAMIN B12: Vitamin B-12: 800 pg/mL (ref 211–911)

## 2019-11-20 LAB — H. PYLORI ANTIBODY, IGG: H Pylori IgG: POSITIVE — AB

## 2019-11-20 LAB — VITAMIN D 25 HYDROXY (VIT D DEFICIENCY, FRACTURES): VITD: 31.37 ng/mL (ref 30.00–100.00)

## 2019-11-20 LAB — T3, FREE: T3, Free: 5.3 pg/mL — ABNORMAL HIGH (ref 2.3–4.2)

## 2019-11-20 LAB — T4, FREE: Free T4: 1.2 ng/dL (ref 0.60–1.60)

## 2019-11-20 MED ORDER — DESVENLAFAXINE SUCCINATE ER 100 MG PO TB24: 100.0000 mg | ORAL_TABLET | Freq: Every day | ORAL | 3 refills | Status: DC

## 2019-11-20 MED ORDER — PANTOPRAZOLE SODIUM 40 MG PO TBEC: 40.0000 mg | DELAYED_RELEASE_TABLET | Freq: Every day | ORAL | 3 refills | Status: DC

## 2019-11-20 MED FILL — DESVENLAFAXINE SUC ER 100 M: 100 | 30 days supply | Qty: 30 | Fill #0

## 2019-11-20 MED FILL — PANTOPRAZOLE SOD DR 40 MG T: 40 | 30 days supply | Qty: 30 | Fill #0

## 2019-11-20 NOTE — Patient Instructions (Signed)
Please go to the lab today for blood work.  I will call you with your results. We will alter treatment regimen(s) if indicated by your results.   Please stop the Omeprazole. Start the Protonix once daily. Stop the Effexor. Start the Pristiq daily in its place. Avoid late-night eating and trigger foods for reflux (see diet below).  Let's follow-up in 3-4 weeks to make further adjustments. Sooner if needed.   Food Choices for Gastroesophageal Reflux Disease, Adult When you have gastroesophageal reflux disease (GERD), the foods you eat and your eating habits are very important. Choosing the right foods can help ease your discomfort. Think about working with a nutrition specialist (dietitian) to help you make good choices. What are tips for following this plan?  Meals  Choose healthy foods that are low in fat, such as fruits, vegetables, whole grains, low-fat dairy products, and lean meat, fish, and poultry.  Eat small meals often instead of 3 large meals a day. Eat your meals slowly, and in a place where you are relaxed. Avoid bending over or lying down until 2-3 hours after eating.  Avoid eating meals 2-3 hours before bed.  Avoid drinking a lot of liquid with meals.  Cook foods using methods other than frying. Bake, grill, or broil food instead.  Avoid or limit: ? Chocolate. ? Peppermint or spearmint. ? Alcohol. ? Pepper. ? Black and decaffeinated coffee. ? Black and decaffeinated tea. ? Bubbly (carbonated) soft drinks. ? Caffeinated energy drinks and soft drinks.  Limit high-fat foods such as: ? Fatty meat or fried foods. ? Whole milk, cream, butter, or ice cream. ? Nuts and nut butters. ? Pastries, donuts, and sweets made with butter or shortening.  Avoid foods that cause symptoms. These foods may be different for everyone. Common foods that cause symptoms include: ? Tomatoes. ? Oranges, lemons, and limes. ? Peppers. ? Spicy food. ? Onions and  garlic. ? Vinegar. Lifestyle  Maintain a healthy weight. Ask your doctor what weight is healthy for you. If you need to lose weight, work with your doctor to do so safely.  Exercise for at least 30 minutes for 5 or more days each week, or as told by your doctor.  Wear loose-fitting clothes.  Do not smoke. If you need help quitting, ask your doctor.  Sleep with the head of your bed higher than your feet. Use a wedge under the mattress or blocks under the bed frame to raise the head of the bed. Summary  When you have gastroesophageal reflux disease (GERD), food and lifestyle choices are very important in easing your symptoms.  Eat small meals often instead of 3 large meals a day. Eat your meals slowly, and in a place where you are relaxed.  Limit high-fat foods such as fatty meat or fried foods.  Avoid bending over or lying down until 2-3 hours after eating.  Avoid peppermint and spearmint, caffeine, alcohol, and chocolate. This information is not intended to replace advice given to you by your health care provider. Make sure you discuss any questions you have with your health care provider. Document Revised: 11/21/2018 Document Reviewed: 09/05/2016 Elsevier Patient Education  [place].

## 2019-11-20 NOTE — Progress Notes (Signed)
Patient presents to clinic today transferring care from our [facility]. Records available in EMR and have been reviewed.   Patient with history of both generalized and situational anxiety, currently on a regimen of Effexor 150 mg daily.  Notes that has helped her somewhat with her anxiety.  No residual panic attack.  Notes substantial sexual side effects with medication including significantly decreased libido.  Would like to discuss alternative medications.  Has previously been on other medications including citalopram, sertraline, Wellbutrin and Xanax which she could not tolerate.  Does use hydroxyzine as needed for sleep with good result.  Also currently on a regimen of propranolol 10 mg 3 times daily that she was placed on previously for tachycardia secondary to anxiety/stress levels.  Notes she feels much more comfortable at her new position.  Denies history of arrhythmia.  Not sure she still needs beta-blocker.  Patient also endorsing several months of worsening fatigue.  Does have history of iron deficiency anemia previously requiring iron infusions.  Has not been on iron supplement in a year or more.  Notes sleeping well overall at night.  Has been has not noted any excessive snoring or apneic symptoms.  Patient states she does have a history of borderline vitamin D deficiency.  Denies any known vitamin B12 deficiency or hypothyroid state.  Does keep well-hydrated and tries to get plenty of rest at night.  Keeps very active, working out every morning.  Notes she can work through fatigue for this but afterwards she is tired for the rest of the day.  Denies any shortness of breath with exertion.  Denies lightheadedness, dizziness or syncope.  Patient also with history of gastroesophageal reflux disease, present for the past decade.  Has previously undergone EGD and colonoscopy which she notes was unremarkable.  (Will need these records).  Notes taking her omeprazole 20 mg daily with daily  breakthrough symptoms including heartburn and occasional epigastric discomfort.  Does note occasional nausea but denies any vomiting.  Denies any melena, hematochezia or tenesmus.  Health Maintenance: Immunizations --we will obtain records PAP --we will obtain records  Past Medical History:  Diagnosis Date  . Acid reflux   . Anxiety   . Cervical disc disorder 09/14/2017   Very minimal disc space narrowing C4-5 and C5-6 by x-ray.  . IBS (irritable bowel syndrome)   . Immunity status testing 04/16/2013   Tested positive for immunity measles/mumps/rubella and varicella.  . Iron deficiency anemia    Patient reports having to have iron/blood infusions in the past.  . Migraines   . Pancreatitis   . Placenta previa   . Prediabetes 11/08/2018  . Protein S deficiency ([place])   . Right upper quadrant abdominal pain 04/17/2017  . Vitamin D deficiency     Past Surgical History:  Procedure Laterality Date  . CESAREAN SECTION     x 4.  2007, 2008, 2009, 2010.  2007 was emergent C-section.  . TONSILLECTOMY  2000  . Tubal sterilization reversal  01/2019    Current Outpatient Medications on File Prior to Visit  Medication Sig Dispense Refill  . Cholecalciferol (VITAMIN D3) 25 MCG (1000 UT) CHEW Chew 1 tablet by mouth daily.    . hydrOXYzine (VISTARIL) 50 MG capsule Take 1 capsule (50 mg total) by mouth at bedtime. 90 capsule 1  . Multiple Vitamin (MULTIVITAMIN WITH MINERALS) TABS tablet Take 1 tablet by mouth daily.    Marland Kitchen omeprazole (PRILOSEC) 20 MG capsule Take 1 po BID x 2 weeks  then once a day 180 capsule 1  . propranolol (INDERAL) 10 MG tablet Take 1 tablet (10 mg total) by mouth 3 (three) times daily. 90 tablet 1  . venlafaxine XR (EFFEXOR XR) 150 MG 24 hr capsule Take 1 capsule (150 mg total) by mouth daily with breakfast. 90 capsule 1   No current facility-administered medications on file prior to visit.    Allergies  Allergen Reactions  . Codeine Other (See Comments)    unknown     Family History  Problem Relation Age of Onset  . Hypertension Mother     Social History   Socioeconomic History  . Marital status: Married    Spouse name: Audelia Hives  . Number of children: 4  . Years of education: Not on file  . Highest education level: Not on file  Occupational History  . Not on file  Tobacco Use  . Smoking status: Never Smoker  . Smokeless tobacco: Never Used  Substance and Sexual Activity  . Alcohol use: No  . Drug use: No  . Sexual activity: Yes  Other Topics Concern  . Not on file  Social History Narrative   Marital status/children/pets: Married.  4 children.   Education/employment: Some college.  Employed as an Photographer:       Social Determinants of Radio broadcast assistant Strain:   . Difficulty of Paying Living Expenses:   Food Insecurity:   . Worried About Charity fundraiser in the Last Year:   . Arboriculturist in the Last Year:   Transportation Needs:   . Film/video editor (Medical):   Marland Kitchen Lack of Transportation (Non-Medical):   Physical Activity:   . Days of Exercise per Week:   . Minutes of Exercise per Session:   Stress:   . Feeling of Stress :   Social Connections:   . Frequency of Communication with Friends and Family:   . Frequency of Social Gatherings with Friends and Family:   . Attends Religious Services:   . Active Member of Clubs or Organizations:   . Attends Archivist Meetings:   Marland Kitchen Marital Status:   Intimate Partner Violence:   . Fear of Current or Ex-Partner:   . Emotionally Abused:   Marland Kitchen Physically Abused:   . Sexually Abused:    Review of Systems  Constitutional: Positive for malaise/fatigue. Negative for fever and weight loss.  HENT: Negative for ear discharge, ear pain, hearing loss and tinnitus.   Eyes: Negative for blurred vision, double vision, photophobia and pain.  Respiratory: Negative for cough and shortness of breath.   Cardiovascular: Negative for chest pain and palpitations.   Gastrointestinal: Positive for heartburn and nausea. Negative for abdominal pain, blood in stool, constipation, diarrhea, melena and vomiting.  Genitourinary: Negative for dysuria, flank pain, frequency, hematuria and urgency.  Musculoskeletal: Negative for falls.  Neurological: Negative for dizziness, loss of consciousness and headaches.  Endo/Heme/Allergies: Negative for environmental allergies.  Psychiatric/Behavioral: Negative for depression, hallucinations, substance abuse and suicidal ideas. The patient is nervous/anxious. The patient does not have insomnia.     Wt 148 lb (67.1 kg)   BMI 25.40 kg/m   Physical Exam Vitals reviewed.  Constitutional:      Appearance: Normal appearance.  HENT:     Head: Normocephalic and atraumatic.  Eyes:     Conjunctiva/sclera: Conjunctivae normal.     Pupils: Pupils are equal, round, and reactive to light.  Cardiovascular:     Rate and  Rhythm: Normal rate and regular rhythm.     Pulses: Normal pulses.     Heart sounds: Normal heart sounds.  Pulmonary:     Effort: Pulmonary effort is normal.     Breath sounds: Normal breath sounds.  Abdominal:     General: Bowel sounds are normal. There is no distension.     Palpations: Abdomen is soft. There is no mass.     Tenderness: There is no abdominal tenderness. There is no guarding.  Musculoskeletal:     Cervical back: Neck supple.  Lymphadenopathy:     Cervical: No cervical adenopathy.  Neurological:     General: No focal deficit present.     Mental Status: She is alert.  Psychiatric:        Mood and Affect: Mood normal.    Recent Results (from the past 2160 hour(s))  TSH     Status: None   Collection Time: 08/25/19  3:41 PM  Result Value Ref Range   TSH 1.12 mIU/L    Comment:           Reference Range .           > or = 20 Years  0.40-4.50 .                Pregnancy Ranges           First trimester    0.26-2.66           Second trimester   0.55-2.73           Third trimester     0.43-2.91   T3, free     Status: None   Collection Time: 08/25/19  3:41 PM  Result Value Ref Range   T3, Free 2.6 2.3 - 4.2 pg/mL  T4, free     Status: None   Collection Time: 08/25/19  3:41 PM  Result Value Ref Range   Free T4 1.0 0.8 - 1.8 ng/dL  hCG, serum, qualitative     Status: None   Collection Time: 08/25/19  3:41 PM  Result Value Ref Range   Preg, Serum NEGATIVE     Comment: Reference Range Non-Pregnant: Negative Pregnant:     Positive .     Assessment/Plan: 1. Anxiety Stable overall.  Having significant sexual side effects with Effexor.  Has previously been on Wellbutrin and unable to tolerate.  Also has taken several SSRIs that were subtherapeutic.  Will attempt a trial of switching to Pristiq at 100 mg daily, in hopes that we will continue desired effect on anxiety with mitigation of sexual side effects.  We will also check thyroid today to make sure this is stable.  Follow-up 3 weeks. - TSH  2. Chronic fatigue Unclear etiology.  Potentially multifactorial.  Patient does have substantial history of iron deficiency anemia so this needs to be assessed first.  Will check CBC and iron panel today.  We will also check TSH, B12, vitamin D and metabolic panel.  Will treat according to results. - CBC with Differential/Platelet - Comp Met (CMET) - TSH - B12 - Vitamin D (25 hydroxy) - Iron  3. Gastroesophageal reflux disease without esophagitis Significant symptoms despite daily PPI.  No alarm signs or symptoms present.  Will obtain records of prior colonoscopy and upper endoscopy for review.  Will check H. pylori today.  Stop omeprazole.  Start Protonix 40 mg once daily.  GERD diet reviewed.  Close follow-up scheduled.  If not improving will need referral back to gastroenterology. -  H. pylori antibody, IgG  This visit occurred during the SARS-CoV-2 public health emergency.  Safety protocols were in place, including screening questions prior to the visit, additional usage of  staff PPE, and extensive cleaning of exam room while observing appropriate contact time as indicated for disinfecting solutions.     Leeanne Rio, PA-C

## 2019-11-21 ENCOUNTER — Other Ambulatory Visit: Payer: Self-pay | Admitting: Physician Assistant

## 2019-11-21 DIAGNOSIS — A048 Other specified bacterial intestinal infections: Secondary | ICD-10-CM

## 2019-11-21 MED ORDER — CLARITHROMYCIN 250 MG/5ML PO SUSR: 500.0000 mg | Freq: Two times a day (BID) | ORAL | 0 refills | Status: DC

## 2019-11-21 MED ORDER — METRONIDAZOLE 50 MG/ML ORAL SUSPENSION: 500.0000 mg | Freq: Three times a day (TID) | ORAL | 0 refills | Status: DC

## 2019-11-21 MED FILL — METRONIDAZOLE 50MG/ML SUSP: 500 | 14 days supply | Qty: 420 | Fill #0 | Status: TO

## 2019-11-21 MED FILL — METRONIDAZOLE 50MG/ML SUSP: 500 | 14 days supply | Qty: 420 | Fill #0

## 2019-11-24 MED FILL — CLARITHROMYCIN 250 MG/5 ML: 250 | 14 days supply | Qty: 300 | Fill #0

## 2019-11-24 MED FILL — CLARITHROMYCIN 250 MG/5 ML: 250 | 14 days supply | Qty: 300 | Fill #0 | Status: TO

## 2019-11-25 ENCOUNTER — Other Ambulatory Visit: Payer: Self-pay | Admitting: Physician Assistant

## 2019-11-25 ENCOUNTER — Other Ambulatory Visit: Payer: Self-pay | Admitting: Emergency Medicine

## 2019-11-25 ENCOUNTER — Other Ambulatory Visit (INDEPENDENT_AMBULATORY_CARE_PROVIDER_SITE_OTHER): Payer: No Typology Code available for payment source

## 2019-11-25 DIAGNOSIS — D508 Other iron deficiency anemias: Secondary | ICD-10-CM | POA: Diagnosis not present

## 2019-11-25 LAB — FECAL OCCULT BLOOD, IMMUNOCHEMICAL: Fecal Occult Bld: NEGATIVE

## 2019-11-25 MED ORDER — ONDANSETRON HCL 4 MG PO TABS: 4.0000 mg | ORAL_TABLET | Freq: Three times a day (TID) | ORAL | 0 refills | Status: DC | PRN

## 2019-12-02 ENCOUNTER — Telehealth: Payer: Self-pay | Admitting: Physician Assistant

## 2019-12-02 NOTE — Telephone Encounter (Signed)
Please advise 

## 2019-12-02 NOTE — Telephone Encounter (Signed)
Pt wanted to know if she should increase the Pristiq. She states that she is not feeling like herself. Please advise.

## 2019-12-02 NOTE — Telephone Encounter (Signed)
Could potentially increase further but doses of > 100 are only beneficial in some cases versus a higher potential for side effects. If we were to increase would only do so by 25 mg (125 mg total daily). We can also consider changing to an alternative agent. Message sent to patient. Awaiting response.

## 2019-12-11 MED FILL — PROPRANOLOL 10 MG TABLET: 10 | 30 days supply | Qty: 90 | Fill #1

## 2019-12-11 MED FILL — HYDROXYZINE PAM 50 MG CAP: 50 | 90 days supply | Qty: 90 | Fill #1

## 2019-12-25 ENCOUNTER — Encounter: Payer: Self-pay | Admitting: Physician Assistant

## 2019-12-25 ENCOUNTER — Other Ambulatory Visit: Payer: Self-pay

## 2019-12-25 ENCOUNTER — Ambulatory Visit (INDEPENDENT_AMBULATORY_CARE_PROVIDER_SITE_OTHER): Payer: No Typology Code available for payment source | Admitting: Physician Assistant

## 2019-12-25 VITALS — BP 98/62 | HR 71 | Temp 97.7°F | Resp 14 | Ht 64.0 in | Wt 152.0 lb

## 2019-12-25 DIAGNOSIS — A048 Other specified bacterial intestinal infections: Secondary | ICD-10-CM

## 2019-12-25 DIAGNOSIS — F419 Anxiety disorder, unspecified: Secondary | ICD-10-CM | POA: Diagnosis not present

## 2019-12-25 DIAGNOSIS — R7989 Other specified abnormal findings of blood chemistry: Secondary | ICD-10-CM | POA: Diagnosis not present

## 2019-12-25 LAB — T4, FREE: Free T4: 0.68 ng/dL (ref 0.60–1.60)

## 2019-12-25 LAB — T3, FREE: T3, Free: 4.4 pg/mL — ABNORMAL HIGH (ref 2.3–4.2)

## 2019-12-25 LAB — TSH: TSH: 3.79 u[IU]/mL (ref 0.35–4.50)

## 2019-12-25 MED ORDER — VORTIOXETINE HBR 20 MG PO TABS: 20.0000 mg | ORAL_TABLET | Freq: Every day | ORAL | 1 refills | Status: DC

## 2019-12-25 NOTE — Progress Notes (Signed)
Patient presents to clinic today for follow-up regarding anxiety and H. pylori infection.  In regards to anxiety, patient is taking the Pristiq 100 mg daily.  She notes since switching from Effexor she is really noticed no improvement in her mood.  Still having some sexual dysfunction from medication as well.  Denies any worsening anxiety or new symptoms.  Would like to discuss other options today.  In regards to H. pylori infection, patient has completed course of triple therapy, continuing Protonix but once daily.  Notes complete resolution of her symptoms, no longer having any epigastric pain or nausea.  Has not noted any ongoing reflux symptoms as well.   Past Medical History:  Diagnosis Date  . Acid reflux   . Anxiety   . Cervical disc disorder 09/14/2017   Very minimal disc space narrowing C4-5 and C5-6 by x-ray.  . IBS (irritable bowel syndrome)   . Immunity status testing 04/16/2013   Tested positive for immunity measles/mumps/rubella and varicella.  . Iron deficiency anemia    Patient reports having to have iron/blood infusions in the past.  . Migraines   . Pancreatitis   . Placenta previa   . Prediabetes 11/08/2018  . Protein S deficiency ([place])   . Right upper quadrant abdominal pain 04/17/2017  . Vitamin D deficiency     Current Outpatient Medications on File Prior to Visit  Medication Sig Dispense Refill  . Cholecalciferol (VITAMIN D3) 25 MCG (1000 UT) CHEW Chew 1 tablet by mouth daily.    . clarithromycin (BIAXIN) 250 MG/5ML suspension Take 10 mLs (500 mg total) by mouth 2 (two) times daily. For 14 days 280 mL 0  . desvenlafaxine (PRISTIQ) 100 MG 24 hr tablet Take 1 tablet (100 mg total) by mouth daily. 30 tablet 3  . hydrOXYzine (VISTARIL) 50 MG capsule Take 1 capsule (50 mg total) by mouth at bedtime. 90 capsule 1  . metroNIDAZOLE (FLAGYL) 50 mg/ml oral suspension Take 10 mLs (500 mg total) by mouth 3 (three) times daily. 420 mL 0  . Multiple Vitamin (MULTIVITAMIN  WITH MINERALS) TABS tablet Take 1 tablet by mouth daily.    . ondansetron (ZOFRAN) 4 MG tablet Take 1 tablet (4 mg total) by mouth every 8 (eight) hours as needed for nausea or vomiting. 20 tablet 0  . pantoprazole (PROTONIX) 40 MG tablet Take 1 tablet (40 mg total) by mouth daily. 30 tablet 3  . propranolol (INDERAL) 10 MG tablet Take 1 tablet (10 mg total) by mouth 3 (three) times daily. 90 tablet 1   No current facility-administered medications on file prior to visit.    Allergies  Allergen Reactions  . Codeine Other (See Comments)    unknown    Family History  Problem Relation Age of Onset  . Hypertension Mother     Social History   Socioeconomic History  . Marital status: Married    Spouse name: Audelia Hives  . Number of children: 4  . Years of education: Not on file  . Highest education level: Not on file  Occupational History  . Not on file  Tobacco Use  . Smoking status: Never Smoker  . Smokeless tobacco: Never Used  Substance and Sexual Activity  . Alcohol use: No  . Drug use: No  . Sexual activity: Yes  Other Topics Concern  . Not on file  Social History Narrative   Marital status/children/pets: Married.  4 children.   Education/employment: Some college.  Employed as an LPN/CMA   Safety:  Social Determinants of Health   Financial Resource Strain:   . Difficulty of Paying Living Expenses:   Food Insecurity:   . Worried About Charity fundraiser in the Last Year:   . Arboriculturist in the Last Year:   Transportation Needs:   . Film/video editor (Medical):   Marland Kitchen Lack of Transportation (Non-Medical):   Physical Activity:   . Days of Exercise per Week:   . Minutes of Exercise per Session:   Stress:   . Feeling of Stress :   Social Connections:   . Frequency of Communication with Friends and Family:   . Frequency of Social Gatherings with Friends and Family:   . Attends Religious Services:   . Active Member of Clubs or Organizations:   . Attends  Archivist Meetings:   Marland Kitchen Marital Status:     Review of Systems - See HPI.  All other ROS are negative.  There were no vitals taken for this visit.  Physical Exam Vitals reviewed.  Constitutional:      Appearance: Normal appearance.  HENT:     Head: Normocephalic and atraumatic.  Cardiovascular:     Rate and Rhythm: Normal rate and regular rhythm.     Pulses: Normal pulses.     Heart sounds: Normal heart sounds.  Pulmonary:     Effort: Pulmonary effort is normal.  Musculoskeletal:     Cervical back: Neck supple.  Neurological:     Mental Status: She is alert. Mental status is at baseline.  Psychiatric:        Mood and Affect: Mood normal.     Recent Results (from the past 2160 hour(s))  CBC with Differential/Platelet     Status: Abnormal   Collection Time: 11/20/19  9:06 AM  Result Value Ref Range   WBC 5.0 4.0 - 10.5 K/uL   RBC 4.31 3.87 - 5.11 Mil/uL   Hemoglobin 11.0 (L) 12.0 - 15.0 g/dL   HCT 34.3 (L) 36.0 - 46.0 %   MCV 79.6 78.0 - 100.0 fl   MCHC 32.0 30.0 - 36.0 g/dL   RDW 15.7 (H) 11.5 - 15.5 %   Platelets 336.0 150.0 - 400.0 K/uL   Neutrophils Relative % 66.4 43.0 - 77.0 %   Lymphocytes Relative 23.5 12.0 - 46.0 %   Monocytes Relative 8.2 3.0 - 12.0 %   Eosinophils Relative 1.4 0.0 - 5.0 %   Basophils Relative 0.5 0.0 - 3.0 %   Neutro Abs 3.3 1.4 - 7.7 K/uL   Lymphs Abs 1.2 0.7 - 4.0 K/uL   Monocytes Absolute 0.4 0.1 - 1.0 K/uL   Eosinophils Absolute 0.1 0.0 - 0.7 K/uL   Basophils Absolute 0.0 0.0 - 0.1 K/uL  Comp Met (CMET)     Status: None   Collection Time: 11/20/19  9:06 AM  Result Value Ref Range   Sodium 138 135 - 145 mEq/L   Potassium 4.5 3.5 - 5.1 mEq/L   Chloride 104 96 - 112 mEq/L   CO2 27 19 - 32 mEq/L   Glucose, Bld 79 70 - 99 mg/dL   BUN 12 6 - 23 mg/dL   Creatinine, Ser 0.83 0.40 - 1.20 mg/dL   Total Bilirubin 0.4 0.2 - 1.2 mg/dL   Alkaline Phosphatase 64 39 - 117 U/L   AST 17 0 - 37 U/L   ALT 13 0 - 35 U/L   Total  Protein 6.8 6.0 - 8.3 g/dL   Albumin 4.2 3.5 -  5.2 g/dL   GFR 92.94 >60.00 mL/min   Calcium 9.2 8.4 - 10.5 mg/dL  TSH     Status: Abnormal   Collection Time: 11/20/19  9:06 AM  Result Value Ref Range   TSH 0.02 (L) 0.35 - 4.50 uIU/mL  B12     Status: None   Collection Time: 11/20/19  9:06 AM  Result Value Ref Range   Vitamin B-12 800 211 - 911 pg/mL  Vitamin D (25 hydroxy)     Status: None   Collection Time: 11/20/19  9:06 AM  Result Value Ref Range   VITD 31.37 30.00 - 100.00 ng/mL  Iron     Status: Abnormal   Collection Time: 11/20/19  9:06 AM  Result Value Ref Range   Iron 35 (L) 42 - 145 ug/dL  H. pylori antibody, IgG     Status: Abnormal   Collection Time: 11/20/19  9:06 AM  Result Value Ref Range   H Pylori IgG Positive (A) Negative  T4, free     Status: None   Collection Time: 11/20/19  4:49 PM  Result Value Ref Range   Free T4 1.20 0.60 - 1.60 ng/dL    Comment: Specimens from patients who are undergoing biotin therapy and /or ingesting biotin supplements may contain high levels of biotin.  The higher biotin concentration in these specimens interferes with this Free T4 assay.  Specimens that contain high levels  of biotin may cause false high results for this Free T4 assay.  Please interpret results in light of the total clinical presentation of the patient.    T3, free     Status: Abnormal   Collection Time: 11/20/19  4:49 PM  Result Value Ref Range   T3, Free 5.3 (H) 2.3 - 4.2 pg/mL  Fecal occult blood, imunochemical     Status: None   Collection Time: 11/25/19 10:26 AM   Specimen: Stool  Result Value Ref Range   Fecal Occult Bld Negative Negative    Assessment/Plan: 1. Anxiety No improvement with change in medicine.  Still with sexual dysfunction.  TSH was last checked just slightly so.  We will recheck today.  If still abnormal will refer to endocrinology for further assessment, especially since this can contribute to her anxiety.  For now will wean down off of  Pristiq by having her take a half a tablet daily for 1 week, then half a tablet every other day for 1 week before completely stopping.  We will also start her on Trintellix 20 mg tablets, taking half tablet once daily for a week before increasing to a whole tablet.  Close follow-up discussed.  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  2. Abnormal TSH Repeat thyroid panel today.  If still showing potential for hyperthyroidism, she agrees to see endocrinology as previously discussed. - TSH - T4, free - T3, free  3. H. pylori infection With resolution of symptoms.  We will have her decrease Protonix to 1 tablet every other day for the next couple weeks.  If symptoms remain absent, will wean completely off of PPI.  Once off PPI for a few weeks will obtain test of cure.  If there is any recurrence of symptoms will refer her to gastroenterology.   This visit occurred during the SARS-CoV-2 public health emergency.  Safety protocols were in place, including screening questions prior to the visit, additional usage of staff PPE, and  extensive cleaning of exam room while observing appropriate contact time as indicated for disinfecting solutions.    Leeanne Rio, PA-C

## 2019-12-25 NOTE — Patient Instructions (Addendum)
Please go to the lab today for blood work.  I will call you with your results. We will alter treatment regimen(s) if indicated by your results.   For the Protonix, decrease to every other day over the next week, continuing to watch diet.  Start a daily probiotic (recommend IB Donald Prose)  Let me know how things are going.  We are going to stop the Pristiq and start Trintellix.  We will need to wean down off of the Pristiq before completely stopping.  Take 1/2 tablet of the Pristiq for 1 week, then 1/2 tablet every other day for 1 week before stopping. At the same time, take 1/2 tablet of the Trintellix for 1 week before increasing to 1 tablet daily.   Keep me updated!

## 2019-12-30 ENCOUNTER — Telehealth: Payer: Self-pay | Admitting: Physician Assistant

## 2019-12-30 NOTE — Telephone Encounter (Signed)
Would need to at least let me look at her tooth to determine if antibiotics needed or dental assessment needed.

## 2019-12-30 NOTE — Telephone Encounter (Signed)
Advised patient she would need an appointment for assessment for antibiotic

## 2019-12-30 NOTE — Telephone Encounter (Signed)
Patient is having throbbing tooth pain, and would like to know if Einar Pheasant could send in a prescription for liquid penicillin preferably to [facility] -Address: [address] [place], [place] [address]-

## 2019-12-31 ENCOUNTER — Ambulatory Visit (INDEPENDENT_AMBULATORY_CARE_PROVIDER_SITE_OTHER): Payer: No Typology Code available for payment source | Admitting: Physician Assistant

## 2019-12-31 ENCOUNTER — Encounter: Payer: Self-pay | Admitting: Physician Assistant

## 2019-12-31 ENCOUNTER — Other Ambulatory Visit: Payer: Self-pay

## 2019-12-31 VITALS — BP 102/80 | HR 72 | Temp 98.0°F | Resp 14 | Ht 64.0 in | Wt 152.0 lb

## 2019-12-31 DIAGNOSIS — K0889 Other specified disorders of teeth and supporting structures: Secondary | ICD-10-CM

## 2019-12-31 MED ORDER — AMOXICILLIN-POT CLAVULANATE 250-62.5 MG/5ML PO SUSR: 500.0000 mg | Freq: Three times a day (TID) | ORAL | 0 refills | Status: AC

## 2019-12-31 NOTE — Progress Notes (Signed)
Patient presents to clinic today c/o ongoing but worsening lower R molar pain. Notes biting into something hard and cracking the tooth a few months ago. Has had intermittent pain with eating or brushing in the area. Notes cold/hot sensitivity. Over the past week has noted more constant pain and nighttime symptoms. Denies fever or chills. Denies radiation of pain.   Past Medical History:  Diagnosis Date  . Acid reflux   . Anxiety   . Cervical disc disorder 09/14/2017   Very minimal disc space narrowing C4-5 and C5-6 by x-ray.  . IBS (irritable bowel syndrome)   . Immunity status testing 04/16/2013   Tested positive for immunity measles/mumps/rubella and varicella.  . Iron deficiency anemia    Patient reports having to have iron/blood infusions in the past.  . Migraines   . Pancreatitis   . Placenta previa   . Prediabetes 11/08/2018  . Protein S deficiency ([place])   . Right upper quadrant abdominal pain 04/17/2017  . Vitamin D deficiency     Current Outpatient Medications on File Prior to Visit  Medication Sig Dispense Refill  . Cholecalciferol (VITAMIN D3) 25 MCG (1000 UT) CHEW Chew 1 tablet by mouth daily.    . hydrOXYzine (VISTARIL) 50 MG capsule Take 1 capsule (50 mg total) by mouth at bedtime. 90 capsule 1  . Multiple Vitamin (MULTIVITAMIN WITH MINERALS) TABS tablet Take 1 tablet by mouth daily.    . ondansetron (ZOFRAN) 4 MG tablet Take 1 tablet (4 mg total) by mouth every 8 (eight) hours as needed for nausea or vomiting. 20 tablet 0  . pantoprazole (PROTONIX) 40 MG tablet Take 1 tablet (40 mg total) by mouth daily. 30 tablet 3  . propranolol (INDERAL) 10 MG tablet Take 1 tablet (10 mg total) by mouth 3 (three) times daily. 90 tablet 1  . vortioxetine HBr (TRINTELLIX) 20 MG TABS tablet Take 1 tablet (20 mg total) by mouth daily. 30 tablet 1   No current facility-administered medications on file prior to visit.    Allergies  Allergen Reactions  . Codeine Other (See Comments)      unknown    Family History  Problem Relation Age of Onset  . Hypertension Mother     Social History   Socioeconomic History  . Marital status: Married    Spouse name: Audelia Hives  . Number of children: 4  . Years of education: Not on file  . Highest education level: Not on file  Occupational History  . Not on file  Tobacco Use  . Smoking status: Never Smoker  . Smokeless tobacco: Never Used  Substance and Sexual Activity  . Alcohol use: No  . Drug use: No  . Sexual activity: Yes  Other Topics Concern  . Not on file  Social History Narrative   Marital status/children/pets: Married.  4 children.   Education/employment: Some college.  Employed as an Photographer:       Social Determinants of Radio broadcast assistant Strain:   . Difficulty of Paying Living Expenses:   Food Insecurity:   . Worried About Charity fundraiser in the Last Year:   . Arboriculturist in the Last Year:   Transportation Needs:   . Film/video editor (Medical):   Marland Kitchen Lack of Transportation (Non-Medical):   Physical Activity:   . Days of Exercise per Week:   . Minutes of Exercise per Session:   Stress:   . Feeling of Stress :  Social Connections:   . Frequency of Communication with Friends and Family:   . Frequency of Social Gatherings with Friends and Family:   . Attends Religious Services:   . Active Member of Clubs or Organizations:   . Attends Archivist Meetings:   Marland Kitchen Marital Status:    Review of Systems - See HPI.  All other ROS are negative.  BP 102/80   Pulse 72   Temp 98 F (36.7 C) (Temporal)   Resp 14   Ht 5' 4"  (1.626 m)   Wt 152 lb (68.9 kg)   SpO2 99%   BMI 26.09 kg/m   Physical Exam Vitals reviewed.  Constitutional:      Appearance: Normal appearance.  HENT:     Head: Normocephalic and atraumatic.     Jaw: There is normal jaw occlusion.     Right Ear: Tympanic membrane normal.     Mouth/Throat:     Mouth: Mucous membranes are moist.      Dentition: Abnormal dentition. Dental tenderness, gingival swelling and dental caries present.   Pulmonary:     Effort: Pulmonary effort is normal.  Musculoskeletal:     Cervical back: Neck supple.  Neurological:     General: No focal deficit present.     Mental Status: She is alert and oriented to person, place, and time.     Recent Results (from the past 2160 hour(s))  CBC with Differential/Platelet     Status: Abnormal   Collection Time: 11/20/19  9:06 AM  Result Value Ref Range   WBC 5.0 4.0 - 10.5 K/uL   RBC 4.31 3.87 - 5.11 Mil/uL   Hemoglobin 11.0 (L) 12.0 - 15.0 g/dL   HCT 34.3 (L) 36.0 - 46.0 %   MCV 79.6 78.0 - 100.0 fl   MCHC 32.0 30.0 - 36.0 g/dL   RDW 15.7 (H) 11.5 - 15.5 %   Platelets 336.0 150.0 - 400.0 K/uL   Neutrophils Relative % 66.4 43.0 - 77.0 %   Lymphocytes Relative 23.5 12.0 - 46.0 %   Monocytes Relative 8.2 3.0 - 12.0 %   Eosinophils Relative 1.4 0.0 - 5.0 %   Basophils Relative 0.5 0.0 - 3.0 %   Neutro Abs 3.3 1.4 - 7.7 K/uL   Lymphs Abs 1.2 0.7 - 4.0 K/uL   Monocytes Absolute 0.4 0.1 - 1.0 K/uL   Eosinophils Absolute 0.1 0.0 - 0.7 K/uL   Basophils Absolute 0.0 0.0 - 0.1 K/uL  Comp Met (CMET)     Status: None   Collection Time: 11/20/19  9:06 AM  Result Value Ref Range   Sodium 138 135 - 145 mEq/L   Potassium 4.5 3.5 - 5.1 mEq/L   Chloride 104 96 - 112 mEq/L   CO2 27 19 - 32 mEq/L   Glucose, Bld 79 70 - 99 mg/dL   BUN 12 6 - 23 mg/dL   Creatinine, Ser 0.83 0.40 - 1.20 mg/dL   Total Bilirubin 0.4 0.2 - 1.2 mg/dL   Alkaline Phosphatase 64 39 - 117 U/L   AST 17 0 - 37 U/L   ALT 13 0 - 35 U/L   Total Protein 6.8 6.0 - 8.3 g/dL   Albumin 4.2 3.5 - 5.2 g/dL   GFR 92.94 >60.00 mL/min   Calcium 9.2 8.4 - 10.5 mg/dL  TSH     Status: Abnormal   Collection Time: 11/20/19  9:06 AM  Result Value Ref Range   TSH 0.02 (L) 0.35 - 4.50 uIU/mL  B12     Status: None   Collection Time: 11/20/19  9:06 AM  Result Value Ref Range   Vitamin B-12 800 211 -  911 pg/mL  Vitamin D (25 hydroxy)     Status: None   Collection Time: 11/20/19  9:06 AM  Result Value Ref Range   VITD 31.37 30.00 - 100.00 ng/mL  Iron     Status: Abnormal   Collection Time: 11/20/19  9:06 AM  Result Value Ref Range   Iron 35 (L) 42 - 145 ug/dL  H. pylori antibody, IgG     Status: Abnormal   Collection Time: 11/20/19  9:06 AM  Result Value Ref Range   H Pylori IgG Positive (A) Negative  T4, free     Status: None   Collection Time: 11/20/19  4:49 PM  Result Value Ref Range   Free T4 1.20 0.60 - 1.60 ng/dL    Comment: Specimens from patients who are undergoing biotin therapy and /or ingesting biotin supplements may contain high levels of biotin.  The higher biotin concentration in these specimens interferes with this Free T4 assay.  Specimens that contain high levels  of biotin may cause false high results for this Free T4 assay.  Please interpret results in light of the total clinical presentation of the patient.    T3, free     Status: Abnormal   Collection Time: 11/20/19  4:49 PM  Result Value Ref Range   T3, Free 5.3 (H) 2.3 - 4.2 pg/mL  Fecal occult blood, imunochemical     Status: None   Collection Time: 11/25/19 10:26 AM   Specimen: Stool  Result Value Ref Range   Fecal Occult Bld Negative Negative  TSH     Status: None   Collection Time: 12/25/19 11:59 AM  Result Value Ref Range   TSH 3.79 0.35 - 4.50 uIU/mL  T4, free     Status: None   Collection Time: 12/25/19 11:59 AM  Result Value Ref Range   Free T4 0.68 0.60 - 1.60 ng/dL    Comment: Specimens from patients who are undergoing biotin therapy and /or ingesting biotin supplements may contain high levels of biotin.  The higher biotin concentration in these specimens interferes with this Free T4 assay.  Specimens that contain high levels  of biotin may cause false high results for this Free T4 assay.  Please interpret results in light of the total clinical presentation of the patient.    T3, free      Status: Abnormal   Collection Time: 12/25/19 11:59 AM  Result Value Ref Range   T3, Free 4.4 (H) 2.3 - 4.2 pg/mL    Assessment/Plan: 1. Pain, dental Secondary to a cracked tooth and likely start of infection.  Supportive measures and OTC medications reviewed.  Rx amoxicillin suspension as patient cannot swallow pills.  Contact information for local dental practitioners given to patient.  She is to call and schedule an appointment.  She will let us know if she has any difficulty with this.  This visit occurred during the SARS-CoV-2 public health emergency.  Safety protocols were in place, including screening questions prior to the visit, additional usage of staff PPE, and extensive cleaning of exam room while observing appropriate contact time as indicated for disinfecting solutions.       Leeanne Rio, PA-C

## 2019-12-31 NOTE — Patient Instructions (Addendum)
Please continue use of sensodyne or other sensitive-tooth toothpaste.  Use a soft-bristled toothbrush. Avoid chewing on the affected side.   Take the antibiotic as directed with food. Make sure to take a daily probiotic.  Call a local dentist to set up an appointment.   [facility] is a Teacher, adult education.  Their number is [phone] Let them know if they cannot see you in a timely fashion.

## 2020-01-19 ENCOUNTER — Telehealth: Payer: Self-pay | Admitting: Physician Assistant

## 2020-01-19 MED ORDER — ONDANSETRON HCL 4 MG PO TABS: 4.0000 mg | ORAL_TABLET | Freq: Three times a day (TID) | ORAL | 0 refills | Status: DC | PRN

## 2020-01-19 NOTE — Telephone Encounter (Signed)
Refill sent. If having continued issue with GERD and nausea, she needs to let us set her up with GI as previously discussed.

## 2020-01-19 NOTE — Telephone Encounter (Signed)
Pt needs a rx for the Ondansetron sent to the [facility] in [place]. Please advise

## 2020-01-19 NOTE — Telephone Encounter (Signed)
Pt is aware of this and will let us know about the GI referral.

## 2020-01-27 MED FILL — TRINTELLIX 20 MG TABLET: 20 | 30 days supply | Qty: 30 | Fill #1

## 2020-01-27 MED FILL — PANTOPRAZOLE SOD DR 40 MG T: 40 | 30 days supply | Qty: 30 | Fill #2

## 2020-02-04 ENCOUNTER — Other Ambulatory Visit (INDEPENDENT_AMBULATORY_CARE_PROVIDER_SITE_OTHER): Payer: No Typology Code available for payment source

## 2020-02-04 ENCOUNTER — Other Ambulatory Visit: Payer: Self-pay | Admitting: Physician Assistant

## 2020-02-04 ENCOUNTER — Other Ambulatory Visit: Payer: Self-pay

## 2020-02-04 ENCOUNTER — Encounter: Payer: Self-pay | Admitting: Physician Assistant

## 2020-02-04 ENCOUNTER — Ambulatory Visit (INDEPENDENT_AMBULATORY_CARE_PROVIDER_SITE_OTHER): Payer: No Typology Code available for payment source | Admitting: Physician Assistant

## 2020-02-04 VITALS — BP 118/78 | HR 73 | Temp 97.4°F | Resp 18 | Wt 157.6 lb

## 2020-02-04 DIAGNOSIS — Z3201 Encounter for pregnancy test, result positive: Secondary | ICD-10-CM

## 2020-02-04 LAB — POCT URINE PREGNANCY: Preg Test, Ur: NEGATIVE

## 2020-02-04 LAB — HCG, QUANTITATIVE, PREGNANCY: Quantitative HCG: 68.43 m[IU]/mL

## 2020-02-04 MED ORDER — FLUOXETINE HCL 20 MG PO TABS
20.0000 mg | ORAL_TABLET | Freq: Every day | ORAL | 3 refills | Status: DC
Start: 2020-02-04 — End: 2020-07-12

## 2020-02-04 MED FILL — FLUOXETINE HCL 20 MG TABS: 20 | 30 days supply | Qty: 30 | Fill #0

## 2020-02-04 NOTE — Patient Instructions (Signed)
Please stop the Trintellix.  Start the Fluoxetine as directed at next due dose of medication. Take daily as directed.  Decrease Propranolol to every other day for a week before stopping. Since you have only been taking once daily I am not sure it has been doing much anyway. We will monitor closely.   Stop the Zofran. Continue the Protonix daily as directed.   Start a daily prenatal vitamin with folic acid.   For sleep, follow the practices below.  Can use diphenhydramine (benadryl) for sleep if needed as this is considered safe in pregnancy.  Sleep Hygiene  Do: (1) Go to bed at the same time each day. (2) Get up from bed at the same time each day. (3) Get regular exercise each day, preferably in the morning.  There is goof evidence that regular exercise improves restful sleep.  This includes stretching and aerobic exercise. (4) Get regular exposure to outdoor or bright lights, especially in the late afternoon. (5) Keep the temperature in your bedroom comfortable. (6) Keep the bedroom quiet when sleeping. (7) Keep the bedroom dark enough to facilitate sleep. (8) Use your bed only for sleep and sex. (9) Take medications as directed.  It is helpful to take prescribed sleeping pills 1 hour before bedtime, so they are causing drowsiness when you lie down, or 10 hours before getting up, to avoid daytime drowsiness. (10) Use a relaxation exercise just before going to sleep -- imagery, massage, warm bath. (11) Keep your feet and hands warm.  Wear warm socks and/or mittens or gloves to bed.  Don't: (1) Exercise just before going to bed. (2) Engage in stimulating activity just before bed, such as playing a competitive game, watching an exciting program on television, or having an important discussion with a loved one. (3) Have caffeine in the evening (coffee, teas, chocolate, sodas, etc.) (4) Read or watch television in bed. (5) Use alcohol to help you sleep. (6) Go to bed too hungry or too  full. (7) Take another person's sleeping pills. (8) Take over-the-counter sleeping pills, without your doctor's knowledge.  Tolerance can develop rapidly with these medications.  Diphenhydramine can have serious side effects for elderly patients. (9) Take daytime naps. (10) Command yourself to go to sleep.  This only makes your mind and body more alert.  If you lie awake for more than 20-30 minutes, get up, go to a different room, participate in a quiet activity (Ex - non-excitable reading or television), and then return to bed when you feel sleepy.  Do this as many times during the night as needed.  This may cause you to have a night or two of poor sleep but it will train your brain to know when it is time for sleep.

## 2020-02-04 NOTE — Progress Notes (Signed)
Patient presents to clinic today to discuss possible pregnancy. Patient with history of irregular menstrual cycle. Notes last menstrual. 6 weeks or so ago. Is sexually active with her husband and no current birth control. About a year ago she had her tubal ligation reversed. States she feels fine overall just felt she should check a home pregnancy test since they have been active. Notes having 3+ tests at home. Wants to confirm whether or not she is pregnant and to discuss medication changes that would be needed if she is pregnant.   Past Medical History:  Diagnosis Date  . Acid reflux   . Anxiety   . Cervical disc disorder 09/14/2017   Very minimal disc space narrowing C4-5 and C5-6 by x-ray.  . IBS (irritable bowel syndrome)   . Immunity status testing 04/16/2013   Tested positive for immunity measles/mumps/rubella and varicella.  . Iron deficiency anemia    Patient reports having to have iron/blood infusions in the past.  . Migraines   . Pancreatitis   . Placenta previa   . Prediabetes 11/08/2018  . Protein S deficiency ([place])   . Right upper quadrant abdominal pain 04/17/2017  . Vitamin D deficiency     Current Outpatient Medications on File Prior to Visit  Medication Sig Dispense Refill  . Cholecalciferol (VITAMIN D3) 25 MCG (1000 UT) CHEW Chew 1 tablet by mouth daily.    . Multiple Vitamin (MULTIVITAMIN WITH MINERALS) TABS tablet Take 1 tablet by mouth daily.    . pantoprazole (PROTONIX) 40 MG tablet Take 1 tablet (40 mg total) by mouth daily. 30 tablet 3   No current facility-administered medications on file prior to visit.    Allergies  Allergen Reactions  . Codeine Other (See Comments)    unknown    Family History  Problem Relation Age of Onset  . Hypertension Mother     Social History   Socioeconomic History  . Marital status: Married    Spouse name: Audelia Hives  . Number of children: 4  . Years of education: Not on file  . Highest education level: Not on file    Occupational History  . Not on file  Tobacco Use  . Smoking status: Never Smoker  . Smokeless tobacco: Never Used  Vaping Use  . Vaping Use: Never used  Substance and Sexual Activity  . Alcohol use: No  . Drug use: No  . Sexual activity: Yes  Other Topics Concern  . Not on file  Social History Narrative   Marital status/children/pets: Married.  4 children.   Education/employment: Some college.  Employed as an Photographer:       Social Determinants of Radio broadcast assistant Strain:   . Difficulty of Paying Living Expenses:   Food Insecurity:   . Worried About Charity fundraiser in the Last Year:   . Arboriculturist in the Last Year:   Transportation Needs:   . Film/video editor (Medical):   Marland Kitchen Lack of Transportation (Non-Medical):   Physical Activity:   . Days of Exercise per Week:   . Minutes of Exercise per Session:   Stress:   . Feeling of Stress :   Social Connections:   . Frequency of Communication with Friends and Family:   . Frequency of Social Gatherings with Friends and Family:   . Attends Religious Services:   . Active Member of Clubs or Organizations:   . Attends Archivist Meetings:   .  Marital Status:    Review of Systems - See HPI.  All other ROS are negative.  BP 118/78   Pulse 73   Temp (!) 97.4 F (36.3 C) (Temporal)   Resp 18   Wt 157 lb 9.6 oz (71.5 kg)   SpO2 98%   BMI 27.05 kg/m   Physical Exam Vitals reviewed.  Constitutional:      Appearance: Normal appearance.  HENT:     Head: Normocephalic and atraumatic.  Cardiovascular:     Rate and Rhythm: Normal rate and regular rhythm.     Heart sounds: Normal heart sounds.  Pulmonary:     Breath sounds: Normal breath sounds.  Abdominal:     Palpations: Abdomen is soft.  Musculoskeletal:     Cervical back: Neck supple.  Neurological:     General: No focal deficit present.     Mental Status: She is alert and oriented to person, place, and time.  Psychiatric:         Mood and Affect: Mood normal.     Recent Results (from the past 2160 hour(s))  CBC with Differential/Platelet     Status: Abnormal   Collection Time: 11/20/19  9:06 AM  Result Value Ref Range   WBC 5.0 4.0 - 10.5 K/uL   RBC 4.31 3.87 - 5.11 Mil/uL   Hemoglobin 11.0 (L) 12.0 - 15.0 g/dL   HCT 34.3 (L) 36 - 46 %   MCV 79.6 78.0 - 100.0 fl   MCHC 32.0 30.0 - 36.0 g/dL   RDW 15.7 (H) 11.5 - 15.5 %   Platelets 336.0 150 - 400 K/uL   Neutrophils Relative % 66.4 43 - 77 %   Lymphocytes Relative 23.5 12 - 46 %   Monocytes Relative 8.2 3 - 12 %   Eosinophils Relative 1.4 0 - 5 %   Basophils Relative 0.5 0 - 3 %   Neutro Abs 3.3 1.4 - 7.7 K/uL   Lymphs Abs 1.2 0.7 - 4.0 K/uL   Monocytes Absolute 0.4 0 - 1 K/uL   Eosinophils Absolute 0.1 0 - 0 K/uL   Basophils Absolute 0.0 0 - 0 K/uL  Comp Met (CMET)     Status: None   Collection Time: 11/20/19  9:06 AM  Result Value Ref Range   Sodium 138 135 - 145 mEq/L   Potassium 4.5 3.5 - 5.1 mEq/L   Chloride 104 96 - 112 mEq/L   CO2 27 19 - 32 mEq/L   Glucose, Bld 79 70 - 99 mg/dL   BUN 12 6 - 23 mg/dL   Creatinine, Ser 0.83 0.40 - 1.20 mg/dL   Total Bilirubin 0.4 0.2 - 1.2 mg/dL   Alkaline Phosphatase 64 39 - 117 U/L   AST 17 0 - 37 U/L   ALT 13 0 - 35 U/L   Total Protein 6.8 6.0 - 8.3 g/dL   Albumin 4.2 3.5 - 5.2 g/dL   GFR 92.94 >60.00 mL/min   Calcium 9.2 8.4 - 10.5 mg/dL  TSH     Status: Abnormal   Collection Time: 11/20/19  9:06 AM  Result Value Ref Range   TSH 0.02 (L) 0.35 - 4.50 uIU/mL  B12     Status: None   Collection Time: 11/20/19  9:06 AM  Result Value Ref Range   Vitamin B-12 800 211 - 911 pg/mL  Vitamin D (25 hydroxy)     Status: None   Collection Time: 11/20/19  9:06 AM  Result Value Ref Range  VITD 31.37 30.00 - 100.00 ng/mL  Iron     Status: Abnormal   Collection Time: 11/20/19  9:06 AM  Result Value Ref Range   Iron 35 (L) 42 - 145 ug/dL  H. pylori antibody, IgG     Status: Abnormal   Collection  Time: 11/20/19  9:06 AM  Result Value Ref Range   H Pylori IgG Positive (A) Negative  T4, free     Status: None   Collection Time: 11/20/19  4:49 PM  Result Value Ref Range   Free T4 1.20 0.60 - 1.60 ng/dL    Comment: Specimens from patients who are undergoing biotin therapy and /or ingesting biotin supplements may contain high levels of biotin.  The higher biotin concentration in these specimens interferes with this Free T4 assay.  Specimens that contain high levels  of biotin may cause false high results for this Free T4 assay.  Please interpret results in light of the total clinical presentation of the patient.    T3, free     Status: Abnormal   Collection Time: 11/20/19  4:49 PM  Result Value Ref Range   T3, Free 5.3 (H) 2.3 - 4.2 pg/mL  Fecal occult blood, imunochemical     Status: None   Collection Time: 11/25/19 10:26 AM   Specimen: Stool  Result Value Ref Range   Fecal Occult Bld Negative Negative  TSH     Status: None   Collection Time: 12/25/19 11:59 AM  Result Value Ref Range   TSH 3.79 0.35 - 4.50 uIU/mL  T4, free     Status: None   Collection Time: 12/25/19 11:59 AM  Result Value Ref Range   Free T4 0.68 0.60 - 1.60 ng/dL    Comment: Specimens from patients who are undergoing biotin therapy and /or ingesting biotin supplements may contain high levels of biotin.  The higher biotin concentration in these specimens interferes with this Free T4 assay.  Specimens that contain high levels  of biotin may cause false high results for this Free T4 assay.  Please interpret results in light of the total clinical presentation of the patient.    T3, free     Status: Abnormal   Collection Time: 12/25/19 11:59 AM  Result Value Ref Range   T3, Free 4.4 (H) 2.3 - 4.2 pg/mL    Assessment/Plan: 1. Positive urine pregnancy test 3+ home pregnancy test. Point-of-care urine pregnancy test in office today is questionable for a faint positive line. Will obtain serum quantitative hCG to  further assess and to get a better idea of potential gestation age. Referral to OB placed. Patient start prenatal vitamin. We will switch Trintellix to fluoxetine at 20 mg once daily. Patient to stop hydroxyzine at night. Stop use of as needed Zofran. Reviewed risk versus benefit of continued PPI. Given severity of her prior GERD symptoms we will keep her on Protonix for now. Further change to come from [facility]. - Ambulatory referral to Obstetrics / Gynecology - POCT urine pregnancy - B-HCG Quant; Future   This visit occurred during the SARS-CoV-2 public health emergency.  Safety protocols were in place, including screening questions prior to the visit, additional usage of staff PPE, and extensive cleaning of exam room while observing appropriate contact time as indicated for disinfecting solutions.     Leeanne Rio, PA-C

## 2020-02-05 ENCOUNTER — Other Ambulatory Visit: Payer: Self-pay | Admitting: Physician Assistant

## 2020-02-05 DIAGNOSIS — Z3490 Encounter for supervision of normal pregnancy, unspecified, unspecified trimester: Secondary | ICD-10-CM

## 2020-02-06 ENCOUNTER — Other Ambulatory Visit: Payer: Self-pay

## 2020-02-06 ENCOUNTER — Ambulatory Visit (INDEPENDENT_AMBULATORY_CARE_PROVIDER_SITE_OTHER): Payer: No Typology Code available for payment source

## 2020-02-06 DIAGNOSIS — Z3490 Encounter for supervision of normal pregnancy, unspecified, unspecified trimester: Secondary | ICD-10-CM

## 2020-02-06 LAB — HCG, QUANTITATIVE, PREGNANCY: Quantitative HCG: 214.13 m[IU]/mL

## 2020-02-11 ENCOUNTER — Encounter: Payer: No Typology Code available for payment source | Admitting: Physician Assistant

## 2020-02-20 MED FILL — ENOXAPARIN SODIUM 40 MG/0.4: 40 | 30 days supply | Qty: 12 | Fill #0

## 2020-02-20 MED FILL — DOXYLAMINE-PYRIDOXINE 10-10: 10-10 | 30 days supply | Qty: 60 | Fill #0

## 2020-03-11 MED FILL — FLUOXETINE HCL 20 MG TABS: 20 | 30 days supply | Qty: 30 | Fill #1

## 2020-03-11 MED FILL — PANTOPRAZOLE SOD DR 40 MG T: 40 | 30 days supply | Qty: 30 | Fill #3

## 2020-04-13 ENCOUNTER — Other Ambulatory Visit: Payer: Self-pay | Admitting: Physician Assistant

## 2020-04-13 MED FILL — FLUOXETINE HCL 20 MG TABS: 20 | 30 days supply | Qty: 30 | Fill #2

## 2020-04-13 MED FILL — PANTOPRAZOLE SOD DR 40 MG T: 40 | 90 days supply | Qty: 90 | Fill #0

## 2020-04-13 MED FILL — ENOXAPARIN SODIUM 40 MG/0.4: 40 | 30 days supply | Qty: 12 | Fill #1

## 2020-04-22 ENCOUNTER — Ambulatory Visit: Payer: No Typology Code available for payment source

## 2020-06-07 ENCOUNTER — Other Ambulatory Visit: Payer: Self-pay | Admitting: Family Medicine

## 2020-06-07 MED FILL — FLUOXETINE HCL 20 MG TABS: 20 | 30 days supply | Qty: 30 | Fill #3

## 2020-06-09 ENCOUNTER — Other Ambulatory Visit: Payer: Self-pay | Admitting: Physician Assistant

## 2020-06-09 ENCOUNTER — Telehealth: Payer: Self-pay | Admitting: Physician Assistant

## 2020-06-09 MED ORDER — PROPRANOLOL HCL 10 MG PO TABS: 10.0000 mg | ORAL_TABLET | Freq: Three times a day (TID) | ORAL | 0 refills | Status: DC | PRN

## 2020-06-09 MED FILL — PROPRANOLOL 10 MG TABLET: 10 | 30 days supply | Qty: 90 | Fill #0

## 2020-06-09 NOTE — Telephone Encounter (Signed)
Spoke with patient. Taking once daily for anxiety levels when needed. OB/GYN has approved her to continue this giving only dosing PRN. Refill sent.

## 2020-06-09 NOTE — Telephone Encounter (Signed)
..  Medication Refills  Medication:Propranol  Pharmacy:  [facility]  ** Let patient know to contact pharmacy at the end of the day to make sure medication is ready.**  ** Please notify patient to allow 48-72 hours to process.**  ** Encourage patient to contact the pharmacy for refills or they can request refills through [facility]**  Clinical Fills out below:   Last refill:  QTY:  Refill Date:    Other Comments:   Okay for refill?  Please advise.

## 2020-06-24 MED FILL — PANTOPRAZOLE SOD DR 40 MG T: 40 | 90 days supply | Qty: 90 | Fill #1

## 2020-07-12 ENCOUNTER — Other Ambulatory Visit: Payer: Self-pay | Admitting: Family Medicine

## 2020-07-12 ENCOUNTER — Other Ambulatory Visit: Payer: Self-pay | Admitting: Physician Assistant

## 2020-07-12 MED FILL — FLUoxetine HCL 20 MG TABS: 20 | 90 days supply | Qty: 90 | Fill #0

## 2020-07-14 ENCOUNTER — Inpatient Hospital Stay (HOSPITAL_COMMUNITY)
Admission: AD | Admit: 2020-07-14 | Discharge: 2020-07-14 | Disposition: A | Discharge status: Home / Self Care | Payer: No Typology Code available for payment source | Attending: Obstetrics and Gynecology | Admitting: Obstetrics and Gynecology

## 2020-07-14 ENCOUNTER — Encounter (HOSPITAL_COMMUNITY): Payer: Self-pay | Admitting: Obstetrics and Gynecology

## 2020-07-14 ENCOUNTER — Other Ambulatory Visit: Payer: Self-pay

## 2020-07-14 DIAGNOSIS — R42 Dizziness and giddiness: Secondary | ICD-10-CM

## 2020-07-14 DIAGNOSIS — O26892 Other specified pregnancy related conditions, second trimester: Secondary | ICD-10-CM | POA: Diagnosis present

## 2020-07-14 DIAGNOSIS — Z3A27 27 weeks gestation of pregnancy: Secondary | ICD-10-CM | POA: Diagnosis not present

## 2020-07-14 DIAGNOSIS — O99891 Other specified diseases and conditions complicating pregnancy: Secondary | ICD-10-CM

## 2020-07-14 DIAGNOSIS — D649 Anemia, unspecified: Secondary | ICD-10-CM

## 2020-07-14 DIAGNOSIS — O99012 Anemia complicating pregnancy, second trimester: Secondary | ICD-10-CM | POA: Diagnosis not present

## 2020-07-14 DIAGNOSIS — R0602 Shortness of breath: Secondary | ICD-10-CM | POA: Diagnosis not present

## 2020-07-14 DIAGNOSIS — R202 Paresthesia of skin: Secondary | ICD-10-CM | POA: Diagnosis not present

## 2020-07-14 DIAGNOSIS — Z79899 Other long term (current) drug therapy: Secondary | ICD-10-CM | POA: Diagnosis not present

## 2020-07-14 LAB — COMPREHENSIVE METABOLIC PANEL
ALT: 23 U/L (ref 0–44)
AST: 23 U/L (ref 15–41)
Albumin: 2.9 g/dL — ABNORMAL LOW (ref 3.5–5.0)
Alkaline Phosphatase: 70 U/L (ref 38–126)
Anion gap: 11 (ref 5–15)
BUN: 6 mg/dL (ref 6–20)
CO2: 22 mmol/L (ref 22–32)
Calcium: 9.1 mg/dL (ref 8.9–10.3)
Chloride: 103 mmol/L (ref 98–111)
Creatinine, Ser: 0.68 mg/dL (ref 0.44–1.00)
GFR, Estimated: >60 mL/min (ref >60)
Glucose, Bld: 81 mg/dL (ref 70–99)
Potassium: 4 mmol/L (ref 3.5–5.1)
Sodium: 136 mmol/L (ref 135–145)
Total Bilirubin: 0.5 mg/dL (ref 0.3–1.2)
Total Protein: 6.5 g/dL (ref 6.5–8.1)

## 2020-07-14 LAB — URINALYSIS, ROUTINE W REFLEX MICROSCOPIC
Bilirubin Urine: NEGATIVE
Glucose, UA: NEGATIVE mg/dL
Hgb urine dipstick: NEGATIVE
Ketones, ur: NEGATIVE mg/dL
Leukocytes,Ua: NEGATIVE
Nitrite: NEGATIVE
Protein, ur: NEGATIVE mg/dL
Specific Gravity, Urine: 1.005 (ref 1.005–1.030)
pH: 9 — ABNORMAL HIGH (ref 5.0–8.0)

## 2020-07-14 LAB — CBC
HCT: 28.9 % — ABNORMAL LOW (ref 36.0–46.0)
Hemoglobin: 8.3 g/dL — ABNORMAL LOW (ref 12.0–15.0)
MCH: 19.5 pg — ABNORMAL LOW (ref 26.0–34.0)
MCHC: 28.7 g/dL — ABNORMAL LOW (ref 30.0–36.0)
MCV: 68 fL — ABNORMAL LOW (ref 80.0–100.0)
Platelets: 284 10*3/uL (ref 150–400)
RBC: 4.25 MIL/uL (ref 3.87–5.11)
RDW: 17.6 % — ABNORMAL HIGH (ref 11.5–15.5)
WBC: 11.9 10*3/uL — ABNORMAL HIGH (ref 4.0–10.5)
nRBC: 0.5 % — ABNORMAL HIGH (ref 0.0–0.2)

## 2020-07-14 MED ORDER — SODIUM CHLORIDE 0.9 % IV SOLN
510.0000 mg | Freq: Once | INTRAVENOUS | Status: AC
  Administered 2020-07-14: 510 mg via INTRAVENOUS
  Filled 2020-07-14: qty 17

## 2020-07-14 NOTE — MAU Note (Signed)
Pt reports she has been having SOB for several weeks now thinks it is due to anemia. SOB worse today hard for her walk or do any activity with out getting out of breath. Reports some tingling in her right hand(fingers)

## 2020-07-14 NOTE — MAU Provider Note (Signed)
Chief Complaint:  Shortness of Breath   First Provider Initiated Contact with Patient 07/14/20 1321      HPI: Jennifer Summers is a 38 y.o. G6P0104 at [redacted]w[redacted]d by LMP who presents to maternity admissions reporting shortness of breath x several weeks, worsening in the last 2 days. She reports some tingling of her hands intermittently over the same time frame. The shortness of breath is worse with activity or with lying down, but improved with sitting up. It has gradually worsened over 2 weeks.  She has associated dizziness when standing.  She has no respiratory symptoms and denies sick contacts.  She reports good fetal movement, denies LOF, vaginal bleeding, vaginal itching/burning, urinary symptoms, n/v, or fever/chills.     HPI  Past Medical History: Past Medical History:  Diagnosis Date  . Acid reflux   . Anxiety   . Cervical disc disorder 09/14/2017   Very minimal disc space narrowing C4-5 and C5-6 by x-ray.  . IBS (irritable bowel syndrome)   . Immunity status testing 04/16/2013   Tested positive for immunity measles/mumps/rubella and varicella.  . Iron deficiency anemia    Patient reports having to have iron/blood infusions in the past.  . Migraines   . Pancreatitis   . Placenta previa   . Prediabetes 11/08/2018  . Protein S deficiency ([place])   . Right upper quadrant abdominal pain 04/17/2017  . Vitamin D deficiency     Past obstetric history: OB History  Gravida Para Term Preterm AB Living  6 4   1  0 4  SAB TAB Ectopic Multiple Live Births  0       4    # Outcome Date GA Lbr Len/2nd Weight Sex Delivery Anes PTL Lv  6 Current           5 Gravida     F CS-LVertical     53 Para     F CS-LVertical     3 Para     F CS-LVertical     2 Preterm  [redacted]w[redacted]d    CS-LVertical   FD     Complications: Antepartum placental abruption  1 Para     F Vag-Spont       Past Surgical History: Past Surgical History:  Procedure Laterality Date  . CESAREAN SECTION     x 4.  2007, 2008, 2009, 2010.   2007 was emergent C-section.  . TONSILLECTOMY  2000  . Tubal sterilization reversal  01/2019    Family History: Family History  Problem Relation Age of Onset  . Hypertension Mother     Social History: Social History   Tobacco Use  . Smoking status: Never Smoker  . Smokeless tobacco: Never Used  Vaping Use  . Vaping Use: Never used  Substance Use Topics  . Alcohol use: No  . Drug use: No    Allergies:  Allergies  Allergen Reactions  . Codeine Other (See Comments)    unknown    Meds:  No medications prior to admission.    ROS:  Review of Systems  Constitutional: Negative for chills, fatigue and fever.  Eyes: Negative for visual disturbance.  Respiratory: Positive for shortness of breath.   Cardiovascular: Negative for chest pain.  Gastrointestinal: Negative for abdominal pain, nausea and vomiting.  Genitourinary: Negative for difficulty urinating, dysuria, flank pain, pelvic pain, vaginal bleeding, vaginal discharge and vaginal pain.  Neurological: Positive for dizziness. Negative for headaches.  Psychiatric/Behavioral: Negative.      I have reviewed patient's Past Medical  Hx, Surgical Hx, Family Hx, Social Hx, medications and allergies.   Physical Exam   Patient Vitals for the past 24 hrs:  BP Temp Pulse Resp SpO2  07/14/20 1511 115/65 -- 78 -- --  07/14/20 1409 -- -- -- -- 100 %  07/14/20 1404 -- -- -- -- 98 %  07/14/20 1359 -- -- -- -- 99 %  07/14/20 1354 -- -- -- -- 98 %  07/14/20 1349 -- -- -- -- 98 %  07/14/20 1344 -- -- -- -- 99 %  07/14/20 1339 -- -- -- -- 99 %  07/14/20 1334 -- -- -- -- 100 %  07/14/20 1329 -- -- -- -- 99 %  07/14/20 1324 -- -- -- -- 96 %  07/14/20 1314 -- -- -- -- 100 %  07/14/20 1309 -- -- -- -- 99 %  07/14/20 1304 -- -- -- -- 98 %  07/14/20 1300 -- -- -- -- 98 %  07/14/20 1254 -- -- -- -- 97 %  07/14/20 1249 -- -- -- -- 99 %  07/14/20 1244 -- -- -- -- 98 %  07/14/20 1239 -- -- -- -- 100 %  07/14/20 1234 -- -- -- --  99 %  07/14/20 1229 -- -- -- -- 100 %  07/14/20 1224 -- -- -- -- 99 %  07/14/20 1219 -- -- -- -- 98 %  07/14/20 1214 -- -- -- -- 98 %  07/14/20 1209 -- -- -- -- 100 %  07/14/20 1204 -- -- -- -- 100 %  07/14/20 1159 -- -- -- -- 100 %  07/14/20 1154 -- -- -- -- 100 %  07/14/20 1149 -- -- -- -- 99 %  07/14/20 1144 -- -- -- -- 99 %  07/14/20 1139 -- -- -- -- 91 %  07/14/20 1134 -- -- -- -- 99 %  07/14/20 1129 -- -- -- -- 99 %  07/14/20 1124 -- -- -- -- 99 %  07/14/20 1119 -- -- -- -- 99 %  07/14/20 1114 -- -- -- -- 99 %  07/14/20 1109 -- -- -- -- 100 %  07/14/20 1104 -- -- -- -- 99 %  07/14/20 1100 -- -- -- -- 99 %  07/14/20 1054 -- -- -- -- 100 %  07/14/20 1049 -- -- -- -- 98 %  07/14/20 1044 -- -- -- -- 98 %  07/14/20 1039 -- -- -- -- 100 %  07/14/20 1034 -- -- -- -- 98 %  07/14/20 1029 -- -- -- -- 98 %  07/14/20 1024 -- -- -- -- 98 %  07/14/20 1019 -- -- -- -- 97 %  07/14/20 1014 -- -- -- -- 97 %  07/14/20 1009 -- -- -- -- 98 %  07/14/20 1004 -- -- -- -- 98 %  07/14/20 0959 -- -- -- -- 100 %  07/14/20 0942 113/67 98.2 F (36.8 C) 81 20 100 %   Constitutional: Well-developed, well-nourished female in no acute distress.  Cardiovascular: normal rate Respiratory: normal effort GI: Abd soft, non-tender, gravid appropriate for gestational age.  MS: Extremities nontender, no edema, normal ROM Neurologic: Alert and oriented x 4.  GU: Neg CVAT.  PELVIC EXAM: Cervix pink, visually closed, without lesion, scant white creamy discharge, vaginal walls and external genitalia normal Bimanual exam: Cervix 0/long/high, firm, anterior, neg CMT, uterus nontender, nonenlarged, adnexa without tenderness, enlargement, or mass     FHT:  Baseline 145 , moderate variability, accelerations present, no decelerations Contractions: none on toco or  to palpation   Labs: Results for orders placed or performed during the hospital encounter of 07/14/20 (from the past 24 hour(s))  Urinalysis, Routine  w reflex microscopic Urine, Clean Catch     Status: Abnormal   Collection Time: 07/14/20 10:10 AM  Result Value Ref Range   Color, Urine STRAW (A) YELLOW   APPearance CLEAR CLEAR   Specific Gravity, Urine 1.005 1.005 - 1.030   pH 9.0 (H) 5.0 - 8.0   Glucose, UA NEGATIVE NEGATIVE mg/dL   Hgb urine dipstick NEGATIVE NEGATIVE   Bilirubin Urine NEGATIVE NEGATIVE   Ketones, ur NEGATIVE NEGATIVE mg/dL   Protein, ur NEGATIVE NEGATIVE mg/dL   Nitrite NEGATIVE NEGATIVE   Leukocytes,Ua NEGATIVE NEGATIVE  CBC     Status: Abnormal   Collection Time: 07/14/20 11:58 AM  Result Value Ref Range   WBC 11.9 (H) 4.0 - 10.5 K/uL   RBC 4.25 3.87 - 5.11 MIL/uL   Hemoglobin 8.3 (L) 12.0 - 15.0 g/dL   HCT 28.9 (L) 36 - 46 %   MCV 68.0 (L) 80.0 - 100.0 fL   MCH 19.5 (L) 26.0 - 34.0 pg   MCHC 28.7 (L) 30.0 - 36.0 g/dL   RDW 17.6 (H) 11.5 - 15.5 %   Platelets 284 150 - 400 K/uL   nRBC 0.5 (H) 0.0 - 0.2 %  Comprehensive metabolic panel     Status: Abnormal   Collection Time: 07/14/20 11:58 AM  Result Value Ref Range   Sodium 136 135 - 145 mmol/L   Potassium 4.0 3.5 - 5.1 mmol/L   Chloride 103 98 - 111 mmol/L   CO2 22 22 - 32 mmol/L   Glucose, Bld 81 70 - 99 mg/dL   BUN 6 6 - 20 mg/dL   Creatinine, Ser 0.68 0.44 - 1.00 mg/dL   Calcium 9.1 8.9 - 10.3 mg/dL   Total Protein 6.5 6.5 - 8.1 g/dL   Albumin 2.9 (L) 3.5 - 5.0 g/dL   AST 23 15 - 41 U/L   ALT 23 0 - 44 U/L   Alkaline Phosphatase 70 38 - 126 U/L   Total Bilirubin 0.5 0.3 - 1.2 mg/dL   GFR, Estimated >60 >60 mL/min   Anion gap 11 5 - 15      Imaging:  No results found.  MAU Course/MDM: Orders Placed This Encounter  Procedures  . Urinalysis, Routine w reflex microscopic Urine, Clean Catch  . CBC  . Comprehensive metabolic panel  . Discharge patient    Meds ordered this encounter  Medications  . ferumoxytol (FERAHEME) 510 mg in sodium chloride 0.9 % 100 mL IVPB     NST reviewed and appropriate for gestational age Hgb 8.3 today  with symptoms of anemia. Discussed options with pt and Fereheme IV infusion ordered in MAU today. Pt stable at time of discharge Precautions given, message sent to [facility] to set up second infusion in 1 week outpatient Return to MAU as needed for emergencies.     Assessment: 1. Anemia affecting pregnancy in second trimester   2. Dizziness   3. [redacted] weeks gestation of pregnancy     Plan: Discharge home Labor precautions and fetal kick counts  Follow-up Information    Everlene Farrier, MD Follow up.   Specialty: Obstetrics and Gynecology Why: Follow up in the office as scheduled. Your office will schedule you for a second iron infusion outpatient in 1 week. Return to MAU with worsening symptoms. Contact information: [place]  [address] [place] [address] [phone]              Allergies as of 07/14/2020      Reactions   Codeine Other (See Comments)   unknown      Medication List    TAKE these medications   enoxaparin 40 MG/0.4ML injection Commonly known as: LOVENOX Inject 40 mg into the skin daily.   FLUoxetine 20 MG tablet Commonly known as: PROZAC TAKE 1 TABLET BY MOUTH DAILY   multivitamin with minerals Tabs tablet Take 1 tablet by mouth daily.   pantoprazole 40 MG tablet Commonly known as: PROTONIX TAKE 1 TABLET BY MOUTH DAILY.   propranolol 10 MG tablet Commonly known as: INDERAL Take 1 tablet (10 mg total) by mouth 3 (three) times daily as needed.   Vitamin D3 25 MCG (1000 UT) Chew Chew 1 tablet by mouth daily.       Fatima Blank Certified Nurse-Midwife 07/14/2020 10:53 PM

## 2020-07-26 ENCOUNTER — Other Ambulatory Visit (HOSPITAL_COMMUNITY): Payer: Self-pay | Admitting: *Deleted

## 2020-07-26 ENCOUNTER — Telehealth: Payer: Self-pay | Admitting: Physician Assistant

## 2020-07-26 NOTE — Telephone Encounter (Signed)
Would need to be filled by her OB due to pregnancy if they are wanting her continued on this medication.

## 2020-07-26 NOTE — Telephone Encounter (Signed)
Patient advised to contact her OBGYN for refill of medication. She is sending a message to them now

## 2020-07-26 NOTE — Discharge Instructions (Signed)

## 2020-07-26 NOTE — Telephone Encounter (Signed)
Pt needs a 90 day supply of the hydroxyzine 50mg  sent to the [place] outpt pharmacy.   Please advise

## 2020-07-26 NOTE — Telephone Encounter (Signed)
Hydroxyzine is not patient current medication list. Is this medication safe for patient while she is pregnant? Please advise

## 2020-07-27 ENCOUNTER — Other Ambulatory Visit (HOSPITAL_COMMUNITY): Payer: Self-pay | Admitting: Obstetrics and Gynecology

## 2020-07-27 ENCOUNTER — Ambulatory Visit (HOSPITAL_COMMUNITY)
Admission: RE | Admit: 2020-07-27 | Discharge: 2020-07-27 | Disposition: A | Discharge status: Home / Self Care | Payer: No Typology Code available for payment source | Source: Ambulatory Visit | Attending: Obstetrics and Gynecology | Admitting: Obstetrics and Gynecology

## 2020-07-27 ENCOUNTER — Other Ambulatory Visit: Payer: Self-pay

## 2020-07-27 DIAGNOSIS — D509 Iron deficiency anemia, unspecified: Secondary | ICD-10-CM | POA: Insufficient documentation

## 2020-07-27 MED ORDER — SODIUM CHLORIDE 0.9 % IV SOLN
510.0000 mg | Freq: Once | INTRAVENOUS | Status: AC
  Administered 2020-07-27: 12:00:00 510 mg via INTRAVENOUS
  Filled 2020-07-27: qty 17

## 2020-07-27 MED FILL — HYDROXYZINE HCL 25 MG TABS: 25 | 30 days supply | Qty: 30 | Fill #0

## 2020-09-06 ENCOUNTER — Other Ambulatory Visit (HOSPITAL_COMMUNITY): Payer: Self-pay | Admitting: Obstetrics and Gynecology

## 2020-09-07 MED FILL — HYDROXYZINE HCL 25 MG TABS: 25 | 90 days supply | Qty: 90 | Fill #0

## 2020-09-15 ENCOUNTER — Other Ambulatory Visit (HOSPITAL_COMMUNITY): Payer: Self-pay | Admitting: Obstetrics and Gynecology

## 2020-09-15 MED FILL — HEPARIN SOD 5,000 UNIT/ML V: 5000 | 10 days supply | Qty: 20 | Fill #0

## 2020-09-21 ENCOUNTER — Telehealth (HOSPITAL_COMMUNITY): Payer: Self-pay | Admitting: *Deleted

## 2020-09-21 NOTE — Pre-Procedure Instructions (Signed)
Per Dr Edwinna Areola, pt is to hold AM dose of heparin but can take her bedtime dose as long as there is 6 hours between dose and surgery.

## 2020-09-21 NOTE — Telephone Encounter (Signed)
Preadmission screen  

## 2020-09-22 ENCOUNTER — Encounter (HOSPITAL_COMMUNITY): Payer: Self-pay

## 2020-09-22 NOTE — Patient Instructions (Signed)
Imogine Carvell  09/22/2020   Your procedure is scheduled on:  09/28/2020  Arrive at [place] at Entrance C on [address] at [facility]  and [address]. You are invited to use the FREE valet parking or use the Visitor's parking deck.  Pick up the phone at the desk and dial [phone].  Call this number if you have problems the morning of surgery: [phone]  Remember:   Do not eat food:(After Midnight) Desps de medianoche.  Do not drink clear liquids: (After Midnight) Desps de medianoche.  Take these medicines the morning of surgery with A SIP OF WATER:  Take prozac the morning of surgery as prescribed.  Do not take heparin the day of surgery.   Do not wear jewelry, make-up or nail polish.  Do not wear lotions, powders, or perfumes. Do not wear deodorant.  Do not shave 48 hours prior to surgery.  Do not bring valuables to the hospital.  [facility] is not   responsible for any belongings or valuables brought to the hospital.  Contacts, dentures or bridgework may not be worn into surgery.  Leave suitcase in the car. After surgery it may be brought to your room.  For patients admitted to the hospital, checkout time is 11:00 AM the day of              discharge.      Please read over the following fact sheets that you were given:     Preparing for Surgery

## 2020-09-27 ENCOUNTER — Other Ambulatory Visit: Payer: Self-pay

## 2020-09-27 ENCOUNTER — Other Ambulatory Visit (HOSPITAL_COMMUNITY)
Admission: RE | Admit: 2020-09-27 | Discharge: 2020-09-27 | Disposition: A | Discharge status: Home / Self Care | Payer: No Typology Code available for payment source | Source: Ambulatory Visit | Attending: Obstetrics and Gynecology | Admitting: Obstetrics and Gynecology

## 2020-09-27 ENCOUNTER — Encounter (HOSPITAL_COMMUNITY)
Admission: RE | Admit: 2020-09-27 | Discharge: 2020-09-27 | Disposition: A | Discharge status: Home / Self Care | Payer: No Typology Code available for payment source | Source: Ambulatory Visit | Attending: Obstetrics and Gynecology | Admitting: Obstetrics and Gynecology

## 2020-09-27 DIAGNOSIS — Z01812 Encounter for preprocedural laboratory examination: Secondary | ICD-10-CM | POA: Insufficient documentation

## 2020-09-27 DIAGNOSIS — U071 COVID-19: Secondary | ICD-10-CM | POA: Insufficient documentation

## 2020-09-27 LAB — CBC
HCT: 35 % — ABNORMAL LOW (ref 36.0–46.0)
Hemoglobin: 11.2 g/dL — ABNORMAL LOW (ref 12.0–15.0)
MCH: 24.7 pg — ABNORMAL LOW (ref 26.0–34.0)
MCHC: 32 g/dL (ref 30.0–36.0)
MCV: 77.3 fL — ABNORMAL LOW (ref 80.0–100.0)
Platelets: 325 10*3/uL (ref 150–400)
RBC: 4.53 MIL/uL (ref 3.87–5.11)
RDW: 23.8 % — ABNORMAL HIGH (ref 11.5–15.5)
WBC: 6.7 10*3/uL (ref 4.0–10.5)
nRBC: 0 % (ref 0.0–0.2)

## 2020-09-27 LAB — SARS CORONAVIRUS 2 (TAT 6-24 HRS): SARS Coronavirus 2: POSITIVE — AB

## 2020-09-27 NOTE — H&P (Deleted)
  The note originally documented on this encounter has been moved the the encounter in which it belongs.  

## 2020-09-27 NOTE — H&P (Signed)
Jennifer Summers is a 40 y.o. female presenting for repeat cesarean section. Prenatal care complicated by Hx of 4 previous C/S through low vertical incision, Hx of BTL>reversal, AMA declines chromosomal screening, Protein S deficiency on Lovenox 40mg  QD until 36 weeks then heparin 5,000 U BID-last heparin last pm, anxiety/depression on fluoxetine and hydroxyzine @ HS prn. OB History    Gravida  6   Para  4   Term      Preterm  1   AB  0   Living  4     SAB  0   IAB      Ectopic      Multiple      Live Births  4          Past Medical History:  Diagnosis Date  . Acid reflux   . Anxiety   . Cervical disc disorder 09/14/2017   Very minimal disc space narrowing C4-5 and C5-6 by x-ray.  . IBS (irritable bowel syndrome)   . Immunity status testing 04/16/2013   Tested positive for immunity measles/mumps/rubella and varicella.  . Iron deficiency anemia    Patient reports having to have iron/blood infusions in the past.  . Migraines   . Pancreatitis   . Placenta previa   . Prediabetes 11/08/2018  . Protein S deficiency ([place])   . Right upper quadrant abdominal pain 04/17/2017  . Vitamin D deficiency    Past Surgical History:  Procedure Laterality Date  . CESAREAN SECTION     x 4.  2007, 2008, 2009, 2010.  2007 was emergent C-section.  . TONSILLECTOMY  2000  . Tubal sterilization reversal  01/2019   Family History: family history includes Hypertension in her mother. Social History:  reports that she has never smoked. She has never used smokeless tobacco. She reports that she does not drink alcohol and does not use drugs.     Maternal Diabetes: No Genetic Screening: Declined Maternal Ultrasounds/Referrals: Normal Fetal Ultrasounds or other Referrals:  None Maternal Substance Abuse:  No Significant Maternal Medications:  Meds include: Other: fluoxetine, hydroxyzine, Lovenox>heparin-stopped before C/S, IV iron infusion Significant Maternal Lab Results:  Group B Strep  negative Other Comments:  None  Review of Systems  Constitutional: Negative for fever.  Eyes: Negative for visual disturbance.  Gastrointestinal: Negative for abdominal pain.  Neurological: Negative for headaches.   Maternal Medical History:  Fetal activity: Perceived fetal activity is normal.        Last menstrual period 09/05/2019. Maternal Exam:  Abdomen: Fetal presentation: vertex     Physical Exam Cardiovascular:     Rate and Rhythm: Normal rate.  Pulmonary:     Effort: Pulmonary effort is normal.     Prenatal labs: ABO, Rh: --/--/O POS (02/14 4098) Antibody: NEG (02/14 0923) Rubella:   RPR:    HBsAg:    HIV:    GBS:   negative  09/15/20  Assessment/Plan: 40 yo @ 37 6/7 wks with previous low vertical C/S (total 4), S/P BTL reversal for repeat cesarean section. U/S notes posterior placenta.    Shon Millet II 09/27/2020, 6:12 PM

## 2020-09-27 NOTE — Anesthesia Preprocedure Evaluation (Addendum)
Anesthesia Evaluation  Patient identified by MRN, date of birth, ID band Patient awake    Reviewed: Allergy & Precautions, NPO status , Patient's Chart, lab work & pertinent test results, reviewed documented beta blocker date and time   Airway Mallampati: II  TM Distance: >3 FB Neck ROM: Full    Dental no notable dental hx.    Pulmonary pneumonia,  COVID + on screen yesterday 09/27/20   Pulmonary exam normal breath sounds clear to auscultation       Cardiovascular negative cardio ROS Normal cardiovascular exam Rhythm:Regular Rate:Normal     Neuro/Psych  Headaches, PSYCHIATRIC DISORDERS Anxiety Depression    GI/Hepatic Neg liver ROS, GERD  Controlled and Medicated,IBS   Endo/Other  Obesity BMI 36  Renal/GU negative Renal ROS  negative genitourinary   Musculoskeletal negative musculoskeletal ROS (+)   Abdominal   Peds  Hematology  (+) Blood dyscrasia, anemia , Protein S deficiency, was previously on lovenox- now on heparin SQ  H/H 11.2/35, plt 325   Anesthesia Other Findings   Reproductive/Obstetrics (+) Pregnancy Previous section x 5, reversal of tubal in 2020 Hx previa in the past                           Anesthesia Physical Anesthesia Plan  ASA: III  Anesthesia Plan: Spinal   Post-op Pain Management:    Induction:   PONV Risk Score and Plan: Ondansetron, Dexamethasone and Treatment may vary due to age or medical condition  Airway Management Planned: Natural Airway  Additional Equipment: None  Intra-op Plan:   Post-operative Plan:   Informed Consent: I have reviewed the patients History and Physical, chart, labs and discussed the procedure including the risks, benefits and alternatives for the proposed anesthesia with the patient or authorized representative who has indicated his/her understanding and acceptance.     Dental advisory given  Plan Discussed with:  CRNA  Anesthesia Plan Comments: (Multiple prior c sections and a history of previa- high likelihood of significant blood loss in the setting of baseline anemia  CSE  Type and cross x 2 units COVID + symptomatic )       Anesthesia Quick Evaluation

## 2020-09-28 ENCOUNTER — Other Ambulatory Visit: Payer: Self-pay

## 2020-09-28 ENCOUNTER — Inpatient Hospital Stay (HOSPITAL_COMMUNITY): Payer: No Typology Code available for payment source | Admitting: Anesthesiology

## 2020-09-28 ENCOUNTER — Inpatient Hospital Stay (HOSPITAL_COMMUNITY)
Admission: RE | Admit: 2020-09-28 | Payer: No Typology Code available for payment source | Source: Home / Self Care | Admitting: Obstetrics and Gynecology

## 2020-09-28 ENCOUNTER — Encounter (HOSPITAL_COMMUNITY): Payer: Self-pay | Admitting: Obstetrics and Gynecology

## 2020-09-28 ENCOUNTER — Encounter (HOSPITAL_COMMUNITY)
Admission: RE | Disposition: A | Discharge status: Home / Self Care | Payer: Self-pay | Source: Home / Self Care | Attending: Obstetrics and Gynecology

## 2020-09-28 ENCOUNTER — Inpatient Hospital Stay (HOSPITAL_COMMUNITY)
Admission: RE | Admit: 2020-09-28 | Discharge: 2020-09-30 | DRG: 786 | Disposition: A | Discharge status: Home / Self Care | Payer: No Typology Code available for payment source | Attending: Obstetrics and Gynecology | Admitting: Obstetrics and Gynecology

## 2020-09-28 DIAGNOSIS — O99214 Obesity complicating childbirth: Secondary | ICD-10-CM | POA: Diagnosis present

## 2020-09-28 DIAGNOSIS — U071 COVID-19: Secondary | ICD-10-CM | POA: Diagnosis present

## 2020-09-28 DIAGNOSIS — O9912 Other diseases of the blood and blood-forming organs and certain disorders involving the immune mechanism complicating childbirth: Secondary | ICD-10-CM | POA: Diagnosis present

## 2020-09-28 DIAGNOSIS — D6859 Other primary thrombophilia: Secondary | ICD-10-CM | POA: Diagnosis present

## 2020-09-28 DIAGNOSIS — Z3A37 37 weeks gestation of pregnancy: Secondary | ICD-10-CM

## 2020-09-28 DIAGNOSIS — E669 Obesity, unspecified: Secondary | ICD-10-CM | POA: Diagnosis present

## 2020-09-28 DIAGNOSIS — Z3490 Encounter for supervision of normal pregnancy, unspecified, unspecified trimester: Secondary | ICD-10-CM

## 2020-09-28 DIAGNOSIS — O34211 Maternal care for low transverse scar from previous cesarean delivery: Principal | ICD-10-CM | POA: Diagnosis present

## 2020-09-28 DIAGNOSIS — O9852 Other viral diseases complicating childbirth: Secondary | ICD-10-CM | POA: Diagnosis present

## 2020-09-28 LAB — ABO/RH: ABO/RH(D): O POS

## 2020-09-28 LAB — CREATININE, SERUM
Creatinine, Ser: 0.81 mg/dL (ref 0.44–1.00)
GFR, Estimated: >60 mL/min (ref >60)

## 2020-09-28 LAB — RPR: RPR Ser Ql: NONREACTIVE

## 2020-09-28 LAB — PREPARE RBC (CROSSMATCH)

## 2020-09-28 SURGERY — Surgical Case
Anesthesia: Spinal | Wound class: Clean Contaminated

## 2020-09-28 MED ORDER — OXYTOCIN-SODIUM CHLORIDE 30-0.9 UT/500ML-% IV SOLN
2.5000 [IU]/h | INTRAVENOUS | Status: AC
  Administered 2020-09-28: 2.5 [IU]/h via INTRAVENOUS

## 2020-09-28 MED ORDER — SODIUM CHLORIDE 0.9 % IV SOLN
INTRAVENOUS | Status: AC
  Filled 2020-09-28: qty 2

## 2020-09-28 MED ORDER — SIMETHICONE 80 MG PO CHEW
80.0000 mg | CHEWABLE_TABLET | ORAL | Status: DC | PRN
Start: 2020-09-28 — End: 2020-09-30

## 2020-09-28 MED ORDER — POVIDONE-IODINE 10 % EX SWAB: 2.0000 "application " | Freq: Once | CUTANEOUS | Status: DC

## 2020-09-28 MED ORDER — KETOROLAC TROMETHAMINE 30 MG/ML IJ SOLN
30.0000 mg | Freq: Once | INTRAMUSCULAR | Status: AC | PRN
  Administered 2020-09-28: 30 mg via INTRAVENOUS

## 2020-09-28 MED ORDER — LACTATED RINGERS IV SOLN: INTRAVENOUS | Status: DC

## 2020-09-28 MED ORDER — BUPIVACAINE IN DEXTROSE 0.75-8.25 % IT SOLN
INTRATHECAL | Status: DC | PRN
  Administered 2020-09-28: 1.8 mL via INTRATHECAL

## 2020-09-28 MED ORDER — MEPERIDINE HCL 25 MG/ML IJ SOLN
6.2500 mg | INTRAMUSCULAR | Status: DC | PRN
Start: 2020-09-28 — End: 2020-09-28

## 2020-09-28 MED ORDER — SIMETHICONE 80 MG PO CHEW
80.0000 mg | CHEWABLE_TABLET | Freq: Three times a day (TID) | ORAL | Status: DC
  Administered 2020-09-28 – 2020-09-30 (×6): 80 mg via ORAL
  Filled 2020-09-28 (×6): qty 1

## 2020-09-28 MED ORDER — DEXAMETHASONE SODIUM PHOSPHATE 4 MG/ML IJ SOLN
INTRAMUSCULAR | Status: DC | PRN
  Administered 2020-09-28: 10 mg via INTRAVENOUS

## 2020-09-28 MED ORDER — HYDROMORPHONE HCL 1 MG/ML IJ SOLN: 0.2000 mg | INTRAMUSCULAR | Status: DC | PRN

## 2020-09-28 MED ORDER — HYDROMORPHONE HCL 1 MG/ML IJ SOLN: 0.2500 mg | INTRAMUSCULAR | Status: DC | PRN

## 2020-09-28 MED ORDER — ACETAMINOPHEN 500 MG PO TABS
1000.0000 mg | ORAL_TABLET | Freq: Four times a day (QID) | ORAL | Status: DC
  Filled 2020-09-28: qty 2

## 2020-09-28 MED ORDER — MEPERIDINE HCL 25 MG/ML IJ SOLN: 6.2500 mg | INTRAMUSCULAR | Status: DC | PRN

## 2020-09-28 MED ORDER — ENOXAPARIN SODIUM 40 MG/0.4ML ~~LOC~~ SOLN
40.0000 mg | SUBCUTANEOUS | Status: DC
  Administered 2020-09-28 – 2020-09-29 (×2): 40 mg via SUBCUTANEOUS
  Filled 2020-09-28 (×2): qty 0.4

## 2020-09-28 MED ORDER — PROMETHAZINE HCL 25 MG/ML IJ SOLN: 6.2500 mg | INTRAMUSCULAR | Status: DC | PRN

## 2020-09-28 MED ORDER — DIPHENHYDRAMINE HCL 12.5 MG/5ML PO ELIX
25.0000 mg | ORAL_SOLUTION | Freq: Four times a day (QID) | ORAL | Status: DC | PRN
  Filled 2020-09-28: qty 10

## 2020-09-28 MED ORDER — SODIUM CHLORIDE 0.9 % IV SOLN
2.0000 g | INTRAVENOUS | Status: AC
  Administered 2020-09-28: 2 g via INTRAVENOUS

## 2020-09-28 MED ORDER — ZOLPIDEM TARTRATE 5 MG PO TABS: 5.0000 mg | ORAL_TABLET | Freq: Every evening | ORAL | Status: DC | PRN

## 2020-09-28 MED ORDER — OXYTOCIN-SODIUM CHLORIDE 30-0.9 UT/500ML-% IV SOLN
INTRAVENOUS | Status: DC | PRN
  Administered 2020-09-28: 250 mL via INTRAVENOUS
  Administered 2020-09-28: 150 mL via INTRAVENOUS

## 2020-09-28 MED ORDER — KETOROLAC TROMETHAMINE 30 MG/ML IJ SOLN
INTRAMUSCULAR | Status: AC
  Filled 2020-09-28: qty 1

## 2020-09-28 MED ORDER — MORPHINE SULFATE (PF) 0.5 MG/ML IJ SOLN
INTRAMUSCULAR | Status: DC | PRN
  Administered 2020-09-28: .15 mg via INTRATHECAL

## 2020-09-28 MED ORDER — TETANUS-DIPHTH-ACELL PERTUSSIS 5-2.5-18.5 LF-MCG/0.5 IM SUSY: 0.5000 mL | PREFILLED_SYRINGE | Freq: Once | INTRAMUSCULAR | Status: DC

## 2020-09-28 MED ORDER — OXYTOCIN-SODIUM CHLORIDE 30-0.9 UT/500ML-% IV SOLN
INTRAVENOUS | Status: AC
  Filled 2020-09-28: qty 500

## 2020-09-28 MED ORDER — OXYCODONE HCL 5 MG/5ML PO SOLN: 5.0000 mg | Freq: Once | ORAL | Status: DC | PRN

## 2020-09-28 MED ORDER — OXYCODONE HCL 5 MG PO TABS: 5.0000 mg | ORAL_TABLET | ORAL | Status: DC | PRN

## 2020-09-28 MED ORDER — COCONUT OIL OIL
1.0000 "application " | TOPICAL_OIL | Status: DC | PRN
  Administered 2020-09-28: 1 via TOPICAL

## 2020-09-28 MED ORDER — SENNOSIDES-DOCUSATE SODIUM 8.6-50 MG PO TABS
2.0000 | ORAL_TABLET | Freq: Every day | ORAL | Status: DC
  Administered 2020-09-29: 2 via ORAL
  Filled 2020-09-28: qty 2

## 2020-09-28 MED ORDER — MENTHOL 3 MG MT LOZG: 1.0000 | LOZENGE | OROMUCOSAL | Status: DC | PRN

## 2020-09-28 MED ORDER — PRENATAL MULTIVITAMIN CH
1.0000 | ORAL_TABLET | Freq: Every day | ORAL | Status: DC
  Filled 2020-09-28: qty 1

## 2020-09-28 MED ORDER — OXYCODONE HCL 5 MG PO TABS: 5.0000 mg | ORAL_TABLET | Freq: Once | ORAL | Status: DC | PRN

## 2020-09-28 MED ORDER — PHENYLEPHRINE HCL-NACL 20-0.9 MG/250ML-% IV SOLN
INTRAVENOUS | Status: DC | PRN
  Administered 2020-09-28: 60 ug/min via INTRAVENOUS

## 2020-09-28 MED ORDER — ACETAMINOPHEN 160 MG/5ML PO SOLN
1000.0000 mg | Freq: Four times a day (QID) | ORAL | Status: DC
  Administered 2020-09-28 – 2020-09-30 (×7): 1000 mg via ORAL
  Filled 2020-09-28 (×8): qty 40.6

## 2020-09-28 MED ORDER — FLUOXETINE HCL 20 MG PO CAPS
20.0000 mg | ORAL_CAPSULE | Freq: Every day | ORAL | Status: DC
  Administered 2020-09-29: 20 mg via ORAL
  Filled 2020-09-28 (×3): qty 1

## 2020-09-28 MED ORDER — FENTANYL CITRATE (PF) 100 MCG/2ML IJ SOLN
INTRAMUSCULAR | Status: DC | PRN
  Administered 2020-09-28: 15 ug via INTRATHECAL

## 2020-09-28 MED ORDER — DIPHENHYDRAMINE HCL 25 MG PO CAPS: 25.0000 mg | ORAL_CAPSULE | Freq: Four times a day (QID) | ORAL | Status: DC | PRN

## 2020-09-28 MED ORDER — DIBUCAINE (PERIANAL) 1 % EX OINT: 1.0000 "application " | TOPICAL_OINTMENT | CUTANEOUS | Status: DC | PRN

## 2020-09-28 MED ORDER — ONDANSETRON HCL 4 MG/2ML IJ SOLN
INTRAMUSCULAR | Status: DC | PRN
  Administered 2020-09-28: 4 mg via INTRAVENOUS

## 2020-09-28 MED ORDER — OXYCODONE HCL 5 MG/5ML PO SOLN
5.0000 mg | ORAL | Status: DC | PRN
  Administered 2020-09-29 – 2020-09-30 (×4): 5 mg via ORAL
  Filled 2020-09-28 (×4): qty 5

## 2020-09-28 MED ORDER — LIDOCAINE-EPINEPHRINE (PF) 2 %-1:200000 IJ SOLN
INTRAMUSCULAR | Status: DC | PRN
  Administered 2020-09-28 (×2): 5 mL via INTRADERMAL

## 2020-09-28 MED ORDER — COMPLETENATE 29-1 MG PO CHEW
1.0000 | CHEWABLE_TABLET | Freq: Every day | ORAL | Status: DC
  Administered 2020-09-29: 1 via ORAL
  Filled 2020-09-28: qty 1

## 2020-09-28 MED ORDER — WITCH HAZEL-GLYCERIN EX PADS: 1.0000 "application " | MEDICATED_PAD | CUTANEOUS | Status: DC | PRN

## 2020-09-28 SURGICAL SUPPLY — 37 items
ADH SKN CLS APL DERMABOND .7 (GAUZE/BANDAGES/DRESSINGS)
APL SKNCLS STERI-STRIP NONHPOA (GAUZE/BANDAGES/DRESSINGS)
BENZOIN TINCTURE PRP APPL 2/3 (GAUZE/BANDAGES/DRESSINGS) IMPLANT
CHLORAPREP W/TINT 26ML (MISCELLANEOUS) ×2 IMPLANT
CLAMP CORD UMBIL (MISCELLANEOUS) IMPLANT
CLOTH BEACON ORANGE TIMEOUT ST (SAFETY) ×2 IMPLANT
DERMABOND ADVANCED (GAUZE/BANDAGES/DRESSINGS)
DERMABOND ADVANCED .7 DNX12 (GAUZE/BANDAGES/DRESSINGS) IMPLANT
DRSG OPSITE POSTOP 4X10 (GAUZE/BANDAGES/DRESSINGS) ×2 IMPLANT
ELECT REM PT RETURN 9FT ADLT (ELECTROSURGICAL) ×2
ELECTRODE REM PT RTRN 9FT ADLT (ELECTROSURGICAL) ×1 IMPLANT
EXTRACTOR VACUUM M CUP 4 TUBE (SUCTIONS) IMPLANT
GAUZE SPONGE 4X4 8PLY STR LF (GAUZE/BANDAGES/DRESSINGS) ×4 IMPLANT
GLOVE BIO SURGEON STRL SZ7.5 (GLOVE) ×2 IMPLANT
GLOVE BIOGEL PI IND STRL 7.0 (GLOVE) ×1 IMPLANT
GLOVE BIOGEL PI INDICATOR 7.0 (GLOVE) ×1
GOWN STRL REUS W/TWL LRG LVL3 (GOWN DISPOSABLE) ×4 IMPLANT
KIT ABG SYR 3ML LUER SLIP (SYRINGE) ×2 IMPLANT
NEEDLE HYPO 25X5/8 SAFETYGLIDE (NEEDLE) ×2 IMPLANT
NS IRRIG 1000ML POUR BTL (IV SOLUTION) ×2 IMPLANT
PACK C SECTION WH (CUSTOM PROCEDURE TRAY) ×2 IMPLANT
PAD ABD 8X7 1/2 STERILE (GAUZE/BANDAGES/DRESSINGS) ×2 IMPLANT
PAD OB MATERNITY 4.3X12.25 (PERSONAL CARE ITEMS) ×2 IMPLANT
PENCIL SMOKE EVAC W/HOLSTER (ELECTROSURGICAL) ×2 IMPLANT
STAPLER VISISTAT 35W (STAPLE) ×2 IMPLANT
STRIP CLOSURE SKIN 1/2X4 (GAUZE/BANDAGES/DRESSINGS) IMPLANT
SUT MNCRL 0 VIOLET CTX 36 (SUTURE) ×4 IMPLANT
SUT MONOCRYL 0 CTX 36 (SUTURE) ×4
SUT PDS AB 0 CTX 60 (SUTURE) ×4 IMPLANT
SUT PLAIN 0 NONE (SUTURE) IMPLANT
SUT PLAIN 2 0 (SUTURE)
SUT PLAIN 2 0 XLH (SUTURE) ×2 IMPLANT
SUT PLAIN ABS 2-0 CT1 27XMFL (SUTURE) IMPLANT
SUT VIC AB 4-0 KS 27 (SUTURE) ×2 IMPLANT
TOWEL OR 17X24 6PK STRL BLUE (TOWEL DISPOSABLE) ×2 IMPLANT
TRAY FOLEY W/BAG SLVR 14FR LF (SET/KITS/TRAYS/PACK) ×2 IMPLANT
WATER STERILE IRR 1000ML POUR (IV SOLUTION) ×2 IMPLANT

## 2020-09-28 NOTE — Op Note (Signed)
Jennifer Summers, [place] MEDICAL RECORD ST:[id number] ACCOUNT [id number] DATE OF BIRTH:1980-08-22 FACILITY: [facility] LOCATION: [facility] PHYSICIAN:Cohen Boettner E. [name], MD  OPERATIVE REPORT  DATE OF PROCEDURE:  09/28/2020  PREOPERATIVE DIAGNOSES: 1.  Intrauterine pregnancy at 37 and 6/7 weeks. 2.  Previous low vertical cesarean section.  POSTOPERATIVE DIAGNOSES:   1.  Intrauterine pregnancy at 37 and 6/7 weeks. 2.  Previous low vertical cesarean section.  PROCEDURE:  Repeat low transverse cesarean section.  SURGEON:  Everlene Farrier II, MD  ASSISTANT:  Dian Queen, MD  ANESTHESIA:  Combined spinal epidural.  ESTIMATED BLOOD LOSS:  Per operative note.    BLOOD PRODUCTS:  None.  SPECIMENS:  Placenta to pathology.  FINDINGS:  Viable female infant, Apgars, birth weight, arterial cord pH pending.  INDICATIONS AND CONSENT:  This patient is a 40 year old G6 P4 with 4 previous C-sections.  She has a midline skin incision.  Due to a patient history of low vertical cesarean section, a repeat is recommended.  Potential risks and complications have also  been reviewed including but not limited to infection, organ damage, bleeding requiring transfusion of blood products with HIV and hepatitis acquisition, DVT, PE, pneumonia, wound breakdown, fascial dehiscence and hysterectomy.  Prenatal care was also  complicated by protein S deficiency.  She has been on prophylactic Lovenox and was converted to heparin in the last 2-3 weeks.  Her last dose of heparin was last p.m.  She states she understands and agrees.  All questions were answered and consent was  signed on the chart.  DESCRIPTION OF PROCEDURE:  The patient was taken to the operating room where she was identified and her combined spinal epidural anesthesia was placed per anesthesiology.  She was then placed in dorsal supine position with a 15-degree left lateral wedge.   She was prepped vaginally with Betadine.  Foley catheter was placed  and prepped abdominally with ChloraPrep.  Timeout was undertaken.  After 3 minute drying time, she was draped in a sterile fashion.  After testing for adequate spinal anesthesia, skin  was entered through the subumbilical midline scar.  The subcutaneous scarring and scarring on the fascia is quite dense.  Dissection was carried out carefully by elevating the anterior abdominal wall and the peritoneal cavity was entered safely.  This  was extended superiorly and inferiorly.  In order to mobilize the incision enough to allow the procedure to proceed, small incision in the rectus bilaterally is done to release the scar tissue and allow adequate visualization.  Bladder blade was placed.   A low transverse incision was made on the uterus just above the level of the vesicouterine peritoneum.  Dissection was carried out in layers carefully and the uterine cavity was entered bluntly with a hemostat.  The uterine incision was extended with  the fingers.  Artificial rupture of membranes for clear fluid was carried out.  The baby is vertex and the baby was delivered through the incision with a mushroom vacuum extractor with no pop-offs and a gentle pull.  The baby was fully delivered and good  cry and tone is noted.  After 1 minute, the cord was clamped and cut and the baby was handed to waiting pediatrics team.  Cord bloods were drawn and placenta is delivered and sent to pathology.  Careful inspection reveals the uterus to be clean.  Uterus  was then closed in 2 running locking imbricating layers of 0 Monocryl suture, which achieved good hemostasis.  Inspection reveals a normal right ovary.  I could  not identify the left ovary on brief examination.  The rectus fascia is closed with  a 0 PDS suture, taking wide bites being careful to avoid underlying structures and being careful to incorporate the previous rectus incisions that made for good closure.  The subcutaneous layer was closed with interrupted plain and the  skin was  closed with clips.  Dressings were applied.  All counts were correct and the patient was taken to recovery room in stable condition.  HN/NUANCE  D:09/28/2020 T:09/28/2020 JOB:[id number]

## 2020-09-28 NOTE — Progress Notes (Signed)
Patient's daughter tested positive for Covid this past Friday (4 days ago). Patient now tests positive. She notes a mild cough as only symptom. No SOB, no fever. D/W repeat C/S through midline incision. D/W risks including infection, organ damage, bleeding/transfusion-HIV/Hep, DVT/PE, pneumonia, wound breakdown or fascial dehiscence, hysterectomy.  Allergy codeine per her parents after an undescribed experience as a young child. She has taken Percocet for PO pain relief without difficulty. She states she understands and agrees.

## 2020-09-28 NOTE — Lactation Note (Signed)
This note was copied from a baby's chart. Lactation Consultation Note  Patient Name: Jennifer Summers [id number] Date: 09/28/2020 Reason for consult: Initial assessment;Early term 37-38.6wks Age:40 hours P5, ETI female infant. 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  Infant had 2 voids and 2 stools since birth. Per mom, infant is not latching well at the breast, she made 2 attempts since 11 am. Mom hx of BF 2 her children short term for 2 weeks due latch difficulties. Mom attempted to latch infant on her right breast, she made multiple attempts, infant kiss and licks breast but will not open her mouth wide even when LC attempted to do suck training with infant. Mom has large nipples and infant's mouth is small but infant not open mouth, LC fitted mom with 24 mm NS and infant sustained latch and BF for 15 minutes. Infant was given 5 mls of mom's expressed breast milk, when LC was teaching mom hand expression. Mom knows to call RN or LC if she needs further assistance with latching infant at the breast.  Mom will breastfeed infant according to cues, 8 to 12+ times within 24 hours, STS. Mom understands NS is for temporary use and mom will attempt to latch infant tomorrow without NS. Mom was given hand pump and fitted with 27 mm breast flange due to receiving 24 mm NS.  Mom made aware of O/P services, breastfeeding support groups, community resources, and our phone # for post-discharge questions.  Maternal Data Has patient been taught Hand Expression?: Yes Does the patient have breastfeeding experience prior to this delivery?: Yes How long did the patient breastfeed?: Per mom, she BF infant 3 and 4 for only 2 weeks due latch difficulties.  Feeding Mother's Current Feeding Choice: Breast Milk  LATCH Score Latch: Repeated attempts needed to sustain latch, nipple held in mouth throughout feeding, stimulation needed to elicit sucking  reflex.  Audible Swallowing: A few with stimulation  Type of Nipple: Everted at rest and after stimulation  Comfort (Breast/Nipple): Soft / non-tender  Hold (Positioning): Assistance needed to correctly position infant at breast and maintain latch.  LATCH Score: 7   Lactation Tools Discussed/Used Tools: Nipple Jefferson Fuel;Pump Nipple shield size: 24 Breast pump type: Manual Pump Education: Setup, frequency, and cleaning;Milk Storage Reason for Pumping: Due to giving 24 mm NS  Interventions Interventions: Breast feeding basics reviewed;Assisted with latch;Skin to skin;Breast massage;Hand express;Breast compression;Adjust position;Support pillows;Position options;Expressed milk;Hand pump;Education  Discharge Pump: Manual WIC Program: No  Consult Status Consult Status: Follow-up Date: 09/29/20 Follow-up type: In-patient    Vicente Serene 09/28/2020, 6:07 PM

## 2020-09-28 NOTE — Transfer of Care (Signed)
Immediate Anesthesia Transfer of Care Note  Patient: Jennifer Summers  Procedure(s) Performed: REPEAT CESAREAN SECTION EDC: 10-13-20 ALLERGIES: CODEINE (N/A )  Patient Location: PACU  Anesthesia Type:Spinal  Level of Consciousness: awake, alert  and oriented  Airway & Oxygen Therapy: Patient Spontanous Breathing  Post-op Assessment: Report given to RN and Post -op Vital signs reviewed and stable  Post vital signs: Reviewed and stable  Last Vitals:  Vitals Value Taken Time  BP 123/68 09/28/20 0931  Temp 36.6 C 09/28/20 0900  Pulse 77 09/28/20 0945  Resp 16 09/28/20 0945  SpO2 98 % 09/28/20 0945  Vitals shown include unvalidated device data.  Last Pain:  Vitals:   09/28/20 0930  TempSrc:   PainSc: 0-No pain         Complications: No complications documented.

## 2020-09-28 NOTE — Brief Op Note (Signed)
09/28/2020  8:43 AM  PATIENT:  Jennifer Summers  40 y.o. female  PRE-OPERATIVE DIAGNOSIS:  previous x 5  POST-OPERATIVE DIAGNOSIS:  previous x 5  PROCEDURE:  Procedure(s): REPEAT CESAREAN SECTION EDC: 10-13-20 ALLERGIES: CODEINE (N/A)  SURGEON:  Surgeon(s) and Role:    * Everlene Farrier, MD - Primary    * Dian Queen, MD - Assisting  PHYSICIAN ASSISTANT:   ASSI   ANESTHESIA:   epidural and spinal  EBL:  Per anesthesiology note   BLOOD ADMINISTERED:none  DRAINS: Urinary Catheter (Foley)   LOCAL MEDICATIONS USED:  NONE  SPECIMEN:  Source of Specimen:  placenta  DISPOSITION OF SPECIMEN:  PATHOLOGY  COUNTS:  YES  TOURNIQUET:  * No tourniquets in log *  DICTATION: .Other Dictation: Dictation Number  [id number]  PLAN OF CARE: Admit to inpatient   PATIENT DISPOSITION:  PACU - hemodynamically stable.   Delay start of Pharmacological VTE agent (>24hrs) due to surgical blood loss or risk of bleeding: not applicable

## 2020-09-28 NOTE — Anesthesia Postprocedure Evaluation (Signed)
Anesthesia Post Note  Patient: Jennifer Summers  Procedure(s) Performed: REPEAT CESAREAN SECTION EDC: 10-13-20 ALLERGIES: CODEINE (N/A )     Patient location during evaluation: PACU Anesthesia Type: Spinal Level of consciousness: awake and alert and oriented Pain management: pain level controlled Vital Signs Assessment: post-procedure vital signs reviewed and stable Respiratory status: spontaneous breathing, nonlabored ventilation and respiratory function stable Cardiovascular status: blood pressure returned to baseline and stable Postop Assessment: no headache, no backache, spinal receding and patient able to bend at knees Anesthetic complications: no   No complications documented.  Last Vitals:  Vitals:   09/28/20 1249 09/28/20 1430  BP: 121/78   Pulse: 66   Resp: 20   Temp:  36.6 C  SpO2: 97%     Last Pain:  Vitals:   09/28/20 1625  TempSrc:   PainSc: 2    Pain Goal:                   Pervis Hocking

## 2020-09-28 NOTE — Anesthesia Procedure Notes (Signed)
Spinal  Patient location during procedure: OR Start time: 09/28/2020 7:32 AM End time: 09/28/2020 7:37 AM Staffing Performed: anesthesiologist  Anesthesiologist: Pervis Hocking, DO Preanesthetic Checklist Completed: patient identified, IV checked, risks and benefits discussed, surgical consent, monitors and equipment checked, pre-op evaluation and timeout performed Spinal Block Patient position: sitting Prep: DuraPrep and site prepped and draped Patient monitoring: cardiac monitor, continuous pulse ox and blood pressure Approach: midline Location: L3-4 Injection technique: single-shot Needle Needle type: Pencan  Needle gauge: 24 G Needle length: 9 cm Assessment Sensory level: T6 Additional Notes CSE performed, epidural catheter threaded easily- LOR at 8, catheter taped at 13

## 2020-09-29 LAB — CBC
HCT: 28.2 % — ABNORMAL LOW (ref 36.0–46.0)
Hemoglobin: 9.1 g/dL — ABNORMAL LOW (ref 12.0–15.0)
MCH: 25 pg — ABNORMAL LOW (ref 26.0–34.0)
MCHC: 32.3 g/dL (ref 30.0–36.0)
MCV: 77.5 fL — ABNORMAL LOW (ref 80.0–100.0)
Platelets: 280 10*3/uL (ref 150–400)
RBC: 3.64 MIL/uL — ABNORMAL LOW (ref 3.87–5.11)
RDW: 23.2 % — ABNORMAL HIGH (ref 11.5–15.5)
WBC: 11.2 10*3/uL — ABNORMAL HIGH (ref 4.0–10.5)
nRBC: 0.2 % (ref 0.0–0.2)

## 2020-09-29 MED ORDER — DOCUSATE SODIUM 50 MG/5ML PO LIQD
100.0000 mg | Freq: Every day | ORAL | Status: DC
  Filled 2020-09-29: qty 10

## 2020-09-29 MED ORDER — IBUPROFEN 100 MG/5ML PO SUSP
800.0000 mg | Freq: Three times a day (TID) | ORAL | Status: DC
  Administered 2020-09-29 – 2020-09-30 (×3): 800 mg via ORAL
  Filled 2020-09-29 (×3): qty 40

## 2020-09-29 MED ORDER — SENNOSIDES 8.8 MG/5ML PO SYRP
10.0000 mL | ORAL_SOLUTION | Freq: Every day | ORAL | Status: DC
  Filled 2020-09-29 (×2): qty 10

## 2020-09-29 NOTE — Lactation Note (Signed)
This note was copied from a baby's chart. Lactation Consultation Note  Patient Name: Jennifer Summers [id number] Date: 09/29/2020   Age:40 hours LC received call from RN to help assist mom with latch, mom want to be seen by [place] again. When LC entered the room, infant was asleep in the basinet. Per mom, infant had recently finished BF and BF for 20 minutes. Mom is working on infant latching at the breast  and has no questions or concerns for LC at this time.  Maternal Data    Feeding    LATCH Score                    Lactation Tools Discussed/Used Tools: Nipple Jefferson Fuel;Pump  Interventions    Discharge    Consult Status      Jennifer Summers 09/29/2020, 12:59 AM

## 2020-09-29 NOTE — Progress Notes (Signed)
POD # 1   S:  Patient is doing well. No complaints.  O:  BP 113/68 (BP Location: Right Arm)   Pulse 70   Temp 98.3 F (36.8 C) (Oral)   Resp 16   Ht 5\' 3"  (1.6 m)   Wt 92.5 kg   LMP 09/05/2019 (Exact Date)   SpO2 99%   Breastfeeding Unknown   BMI 36.14 kg/m  Results for orders placed or performed during the hospital encounter of 09/28/20 (from the past 24 hour(s))  CBC     Status: Abnormal   Collection Time: 09/29/20  5:35 AM  Result Value Ref Range   WBC 11.2 (H) 4.0 - 10.5 K/uL   RBC 3.64 (L) 3.87 - 5.11 MIL/uL   Hemoglobin 9.1 (L) 12.0 - 15.0 g/dL   HCT 28.2 (L) 36.0 - 46.0 %   MCV 77.5 (L) 80.0 - 100.0 fL   MCH 25.0 (L) 26.0 - 34.0 pg   MCHC 32.3 30.0 - 36.0 g/dL   RDW 23.2 (H) 11.5 - 15.5 %   Platelets 280 150 - 400 K/uL   nRBC 0.2 0.0 - 0.2 %   Abdomen soft and non tender  IMPRESSION: POD # 1 c section Protein S deficiency  COVID +  PLAN:  Patient received a vertical skin incision and has staples - should remain in for 7 days  Lovenox restarted last night Currently asymptomatic in regards to COVID + status

## 2020-09-29 NOTE — Social Work (Signed)
CSW received consult for hx of Anxiety and Depression, as well as for MOB scoring a 10 on the Edinburgh Postnatal Depression Scale. MOB answered 1 for questions #10.  CSW contacted MOB by phone due to Covid positive status, to offer support and complete assessment.  CSW introduced self and role. MOB was pleasant and open with CSW. CSW informed MOB of the reason for consult. MOB was understanding and reported that she thought she answered no to question #10. MOB laughed as she provided explanation. MOB stated she is currently doing alright and glad the pregnancy is over. MOB disclosed she has a diagnosis of anxiety and depression, which she was diagnosed with 1 to 2 years ago. MOB stated she is currently taking Prozac 20mg  to treat, which is helpful. MOB reported she has never been to therapy and identified her mother, in addition to FOB as supports. MOB denies any current SI, HI or being involved in DV.  CSW provided education regarding the baby blues period versus. perinatal mood disorders and provided resources for mental health follow up if concerns arise. CSW recommended self-evaluation during the postpartum time period using the New Mom Checklist. CSW provided review of Sudden Infant Death Syndrome (SIDS) precautions.  MOB reported she has all essential needs for baby. MOB identified Fernande Bras for follow-up care and denies any barriers to care. MOB expressed no additional needs at this time.   CSW identifies no further need for intervention and no barriers to discharge at this time.  Darra Lis, [place] [employer] [phone]

## 2020-09-30 ENCOUNTER — Other Ambulatory Visit (HOSPITAL_COMMUNITY): Payer: Self-pay | Admitting: Obstetrics and Gynecology

## 2020-09-30 LAB — SURGICAL PATHOLOGY

## 2020-09-30 MED ORDER — ENOXAPARIN SODIUM 40 MG/0.4ML ~~LOC~~ SOLN: 40.0000 mg | SUBCUTANEOUS | 1 refills | Status: DC

## 2020-09-30 MED ORDER — IBUPROFEN 100 MG/5ML PO SUSP: 600.0000 mg | Freq: Four times a day (QID) | ORAL | 1 refills | Status: DC | PRN

## 2020-09-30 MED ORDER — OXYCODONE HCL 5 MG/5ML PO SOLN: 5.0000 mg | ORAL | 0 refills | Status: DC | PRN

## 2020-09-30 MED FILL — oxyCODONE HCL 5 MG/5ML SOLN: 5 | 15 days supply | Qty: 473 | Fill #0

## 2020-09-30 NOTE — Lactation Note (Addendum)
This note was copied from a baby's chart. Lactation Consultation Note FOB bottle fed baby all night d/t mom was in so much pain and didn't want to BF at that time.  Patient Name: Jennifer Summers [id number] Date: 09/30/2020   Age:40 hours  Maternal Data    Feeding    LATCH Score                    Lactation Tools Discussed/Used    Interventions    Discharge    Consult Status      Jennifer Summers 09/30/2020, 6:40 AM

## 2020-09-30 NOTE — Discharge Summary (Signed)
Postpartum Discharge Summary       Patient Name: Jennifer Summers DOB: 04-16-1981 MRN: [id number]  Date of admission: 09/28/2020 Delivery date:09/28/2020  Delivering provider: Everlene Farrier  Date of discharge: 09/30/2020  Admitting diagnosis: Pregnancy [Z34.90] Intrauterine pregnancy: [redacted]w[redacted]d    Secondary diagnosis:  Active Problems:   Pregnancy  Additional problems: Protein S Deficiency    Discharge diagnosis: Term Pregnancy Delivered                                              Post partum procedures:  Augmentation: N/A Complications: None  Hospital course: Sceduled C/S   40y.o. yo [id number] admitted to the hospital 09/28/2020 for scheduled cesarean section with the following indication:Prior Uterine Surgery.Delivery details are as follows:  Membrane Rupture Time/Date: 8:04 AM ,09/28/2020   Delivery Method:C-Section, Vacuum Assisted  Details of operation can be found in separate operative note.  Patient had an uncomplicated postpartum course.  She is ambulating, tolerating a regular diet, passing flatus, and urinating well. Patient is discharged home in stable condition on  09/30/20        Newborn Data: Birth date:09/28/2020  Birth time:8:05 AM  Gender:Female  Living status:Living  Apgars:8 ,9  Weight:2740 g     Magnesium Sulfate received: No BMZ received: No Rhophylac:N/A MMR:N/A T-DaP:Given prenatally Flu: Yes Transfusion:No  Physical exam  Vitals:   09/29/20 0530 09/29/20 1321 09/29/20 2040 09/30/20 0620  BP: 113/68 128/86 129/85 (!) 120/93  Pulse: 70 79 81 82  Resp: _0 Temp: 98.3 F (36.8 C) 98.4 F (36.9 C) 98.2 F (36.8 C) 98.3 F (36.8 C)  TempSrc: Oral Oral Oral Oral  SpO2: 99% 99% 100% 100%  Weight:      Height:       General: alert, cooperative and no distress Lochia: appropriate Uterine Fundus: firm Incision: Healing well with no significant drainage DVT Evaluation: No evidence of DVT seen on physical exam. Labs: Lab  Results  Component Value Date   WBC 11.2 (H) 09/29/2020   HGB 9.1 (L) 09/29/2020   HCT 28.2 (L) 09/29/2020   MCV 77.5 (L) 09/29/2020   PLT 280 09/29/2020   CMP Latest Ref Rng & Units 09/27/2020  Glucose 70 - 99 mg/dL -  BUN 6 - 20 mg/dL -  Creatinine 0.44 - 1.00 mg/dL 0.81  Sodium 135 - 145 mmol/L -  Potassium 3.5 - 5.1 mmol/L -  Chloride 98 - 111 mmol/L -  CO2 22 - 32 mmol/L -  Calcium 8.9 - 10.3 mg/dL -  Total Protein 6.5 - 8.1 g/dL -  Total Bilirubin 0.3 - 1.2 mg/dL -  Alkaline Phos 38 - 126 U/L -  AST 15 - 41 U/L -  ALT 0 - 44 U/L -   Edinburgh Score: Edinburgh Postnatal Depression Scale Screening Tool 09/28/2020  I have been able to laugh and see the funny side of things. 0  I have looked forward with enjoyment to things. 0  I have blamed myself unnecessarily when things went wrong. 2  I have been anxious or worried for no good reason. 2  I have felt scared or panicky for no good reason. 2  Things have been getting on top of me. 2  I have been so unhappy that I have had difficulty sleeping. 1  I have felt sad  or miserable. 1  I have been so unhappy that I have been crying. 1  The thought of harming myself has occurred to me. 1  Edinburgh Postnatal Depression Scale Total 12      After visit meds:  Allergies as of 09/30/2020      Reactions   Codeine Other (See Comments)   Unknown childhood reaction.      Medication List    STOP taking these medications   heparin 5000 UNIT/ML injection     TAKE these medications   enoxaparin 40 MG/0.4ML injection Commonly known as: LOVENOX Inject 0.4 mLs (40 mg total) into the skin daily.   FLUoxetine 20 MG tablet Commonly known as: PROZAC TAKE 1 TABLET BY MOUTH DAILY   hydrOXYzine 25 MG tablet Commonly known as: ATARAX/VISTARIL Take 25 mg by mouth at bedtime.   ibuprofen 100 MG/5ML suspension Commonly known as: ADVIL Take 30 mLs (600 mg total) by mouth every 6 (six) hours as needed for mild pain or moderate pain.    oxyCODONE 5 MG/5ML solution Commonly known as: ROXICODONE Take 5 mLs (5 mg total) by mouth every 4 (four) hours as needed for up to 7 days for moderate pain.   pantoprazole 40 MG tablet Commonly known as: PROTONIX TAKE 1 TABLET BY MOUTH DAILY.   prenatal multivitamin Tabs tablet Take 1 tablet by mouth daily at 12 noon.   Slow Iron 160 (50 Fe) MG Tbcr SR tablet Generic drug: ferrous sulfate Take 160 mg by mouth daily.        Discharge home in stable condition Infant Feeding: Breast Infant Disposition:home with mother Discharge instruction: per After Visit Summary and Postpartum booklet. Activity: Advance as tolerated. Pelvic rest for 6 weeks.  Diet: routine diet Anticipated Birth Control: Unsure Postpartum Appointment:1 week  For staple removal dditional Postpartum F/U:   Future Appointments:No future appointments. Follow up Visit:      09/30/2020 Luz Lex, MD

## 2020-10-01 LAB — TYPE AND SCREEN
ABO/RH(D): O POS
Antibody Screen: NEGATIVE
Unit division: 0
Unit division: 0

## 2020-10-01 LAB — BPAM RBC
Blood Product Expiration Date: 202203172359
Blood Product Expiration Date: 202203172359
ISSUE DATE / TIME: 202202131847
Unit Type and Rh: 5100
Unit Type and Rh: 5100

## 2020-10-03 ENCOUNTER — Inpatient Hospital Stay (HOSPITAL_COMMUNITY): Payer: No Typology Code available for payment source

## 2020-10-03 ENCOUNTER — Other Ambulatory Visit: Payer: Self-pay

## 2020-10-03 ENCOUNTER — Ambulatory Visit (HOSPITAL_BASED_OUTPATIENT_CLINIC_OR_DEPARTMENT_OTHER): Payer: No Typology Code available for payment source

## 2020-10-03 ENCOUNTER — Encounter (HOSPITAL_COMMUNITY): Payer: Self-pay | Admitting: Obstetrics & Gynecology

## 2020-10-03 ENCOUNTER — Inpatient Hospital Stay (EMERGENCY_DEPARTMENT_HOSPITAL)
Admission: AD | Admit: 2020-10-03 | Discharge: 2020-10-03 | Disposition: A | Discharge status: Home / Self Care | Payer: No Typology Code available for payment source | Source: Home / Self Care | Attending: Obstetrics & Gynecology | Admitting: Obstetrics & Gynecology

## 2020-10-03 DIAGNOSIS — E876 Hypokalemia: Secondary | ICD-10-CM

## 2020-10-03 DIAGNOSIS — R0603 Acute respiratory distress: Secondary | ICD-10-CM | POA: Diagnosis not present

## 2020-10-03 DIAGNOSIS — R0602 Shortness of breath: Secondary | ICD-10-CM | POA: Diagnosis not present

## 2020-10-03 DIAGNOSIS — M7989 Other specified soft tissue disorders: Secondary | ICD-10-CM

## 2020-10-03 DIAGNOSIS — R600 Localized edema: Secondary | ICD-10-CM | POA: Diagnosis present

## 2020-10-03 DIAGNOSIS — O9853 Other viral diseases complicating the puerperium: Secondary | ICD-10-CM | POA: Diagnosis not present

## 2020-10-03 DIAGNOSIS — O165 Unspecified maternal hypertension, complicating the puerperium: Secondary | ICD-10-CM

## 2020-10-03 DIAGNOSIS — O1205 Gestational edema, complicating the puerperium: Secondary | ICD-10-CM | POA: Insufficient documentation

## 2020-10-03 DIAGNOSIS — D6859 Other primary thrombophilia: Secondary | ICD-10-CM

## 2020-10-03 DIAGNOSIS — R0989 Other specified symptoms and signs involving the circulatory and respiratory systems: Secondary | ICD-10-CM | POA: Insufficient documentation

## 2020-10-03 DIAGNOSIS — O9953 Diseases of the respiratory system complicating the puerperium: Secondary | ICD-10-CM | POA: Diagnosis not present

## 2020-10-03 DIAGNOSIS — U071 COVID-19: Secondary | ICD-10-CM | POA: Insufficient documentation

## 2020-10-03 DIAGNOSIS — R7989 Other specified abnormal findings of blood chemistry: Secondary | ICD-10-CM | POA: Diagnosis not present

## 2020-10-03 DIAGNOSIS — J810 Acute pulmonary edema: Secondary | ICD-10-CM | POA: Diagnosis not present

## 2020-10-03 DIAGNOSIS — R778 Other specified abnormalities of plasma proteins: Secondary | ICD-10-CM

## 2020-10-03 DIAGNOSIS — R6510 Systemic inflammatory response syndrome (SIRS) of non-infectious origin without acute organ dysfunction: Secondary | ICD-10-CM | POA: Diagnosis present

## 2020-10-03 DIAGNOSIS — O99893 Other specified diseases and conditions complicating puerperium: Secondary | ICD-10-CM | POA: Diagnosis not present

## 2020-10-03 LAB — CBC WITH DIFFERENTIAL/PLATELET
Abs Immature Granulocytes: 0.24 10*3/uL — ABNORMAL HIGH (ref 0.00–0.07)
Basophils Absolute: 0 10*3/uL (ref 0.0–0.1)
Basophils Relative: 0 %
Eosinophils Absolute: 0.1 10*3/uL (ref 0.0–0.5)
Eosinophils Relative: 1 %
HCT: 29.5 % — ABNORMAL LOW (ref 36.0–46.0)
Hemoglobin: 9.1 g/dL — ABNORMAL LOW (ref 12.0–15.0)
Immature Granulocytes: 1 %
Lymphocytes Relative: 5 %
Lymphs Abs: 1 10*3/uL (ref 0.7–4.0)
MCH: 24.4 pg — ABNORMAL LOW (ref 26.0–34.0)
MCHC: 30.8 g/dL (ref 30.0–36.0)
MCV: 79.1 fL — ABNORMAL LOW (ref 80.0–100.0)
Monocytes Absolute: 1.5 10*3/uL — ABNORMAL HIGH (ref 0.1–1.0)
Monocytes Relative: 8 %
Neutro Abs: 17.1 10*3/uL — ABNORMAL HIGH (ref 1.7–7.7)
Neutrophils Relative %: 85 %
Platelets: 334 10*3/uL (ref 150–400)
RBC: 3.73 MIL/uL — ABNORMAL LOW (ref 3.87–5.11)
RDW: 21.9 % — ABNORMAL HIGH (ref 11.5–15.5)
WBC: 20 10*3/uL — ABNORMAL HIGH (ref 4.0–10.5)
nRBC: 0.3 % — ABNORMAL HIGH (ref 0.0–0.2)

## 2020-10-03 LAB — COMPREHENSIVE METABOLIC PANEL
ALT: 23 U/L (ref 0–44)
AST: 12 U/L — ABNORMAL LOW (ref 15–41)
Albumin: 2.2 g/dL — ABNORMAL LOW (ref 3.5–5.0)
Alkaline Phosphatase: 102 U/L (ref 38–126)
Anion gap: 10 (ref 5–15)
BUN: 7 mg/dL (ref 6–20)
CO2: 25 mmol/L (ref 22–32)
Calcium: 7.9 mg/dL — ABNORMAL LOW (ref 8.9–10.3)
Chloride: 100 mmol/L (ref 98–111)
Creatinine, Ser: 0.71 mg/dL (ref 0.44–1.00)
GFR, Estimated: >60 mL/min (ref >60)
Glucose, Bld: 84 mg/dL (ref 70–99)
Potassium: 3.1 mmol/L — ABNORMAL LOW (ref 3.5–5.1)
Sodium: 135 mmol/L (ref 135–145)
Total Bilirubin: 0.8 mg/dL (ref 0.3–1.2)
Total Protein: 5.3 g/dL — ABNORMAL LOW (ref 6.5–8.1)

## 2020-10-03 LAB — C-REACTIVE PROTEIN: CRP: 19.4 mg/dL — ABNORMAL HIGH (ref <1.0)

## 2020-10-03 LAB — FIBRINOGEN: Fibrinogen: 567 mg/dL — ABNORMAL HIGH (ref 210–475)

## 2020-10-03 LAB — FERRITIN: Ferritin: 91 ng/mL (ref 11–307)

## 2020-10-03 LAB — HIV ANTIBODY (ROUTINE TESTING W REFLEX): HIV Screen 4th Generation wRfx: NONREACTIVE

## 2020-10-03 LAB — TROPONIN I (HIGH SENSITIVITY): Troponin I (High Sensitivity): 19 ng/L — ABNORMAL HIGH (ref <18)

## 2020-10-03 LAB — D-DIMER, QUANTITATIVE: D-Dimer, Quant: 4.47 ug/mL-FEU — ABNORMAL HIGH (ref 0.00–0.50)

## 2020-10-03 LAB — TSH: TSH: 0.982 u[IU]/mL (ref 0.350–4.500)

## 2020-10-03 LAB — LACTIC ACID, PLASMA: Lactic Acid, Venous: 1.5 mmol/L (ref 0.5–1.9)

## 2020-10-03 LAB — BRAIN NATRIURETIC PEPTIDE: B Natriuretic Peptide: 359.8 pg/mL — ABNORMAL HIGH (ref 0.0–100.0)

## 2020-10-03 MED ORDER — AMLODIPINE BESYLATE 5 MG PO TABS: 5.0000 mg | ORAL_TABLET | Freq: Every day | ORAL | 0 refills | Status: DC

## 2020-10-03 MED ORDER — ACETAMINOPHEN 160 MG/5ML PO SOLN
650.0000 mg | ORAL | Status: DC | PRN
  Administered 2020-10-03 (×2): 650 mg via ORAL
  Filled 2020-10-03 (×3): qty 20.3

## 2020-10-03 MED ORDER — POTASSIUM CHLORIDE CRYS ER 20 MEQ PO TBCR
40.0000 meq | EXTENDED_RELEASE_TABLET | ORAL | Status: DC
  Filled 2020-10-03: qty 2

## 2020-10-03 MED ORDER — ASCORBIC ACID 500 MG PO TABS
500.0000 mg | ORAL_TABLET | Freq: Every day | ORAL | Status: DC
  Filled 2020-10-03: qty 1

## 2020-10-03 MED ORDER — ACETAMINOPHEN 500 MG PO TABS
1000.0000 mg | ORAL_TABLET | Freq: Once | ORAL | Status: DC
  Filled 2020-10-03: qty 2

## 2020-10-03 MED ORDER — SODIUM CHLORIDE 0.9 % IV SOLN
200.0000 mg | Freq: Once | INTRAVENOUS | Status: AC
  Administered 2020-10-03: 200 mg via INTRAVENOUS
  Filled 2020-10-03 (×2): qty 40

## 2020-10-03 MED ORDER — SODIUM CHLORIDE 0.9% FLUSH
3.0000 mL | Freq: Two times a day (BID) | INTRAVENOUS | Status: DC
  Administered 2020-10-03: 3 mL via INTRAVENOUS

## 2020-10-03 MED ORDER — ALBUTEROL SULFATE HFA 108 (90 BASE) MCG/ACT IN AERS
2.0000 | INHALATION_SPRAY | Freq: Four times a day (QID) | RESPIRATORY_TRACT | Status: DC | PRN
  Filled 2020-10-03: qty 6.7

## 2020-10-03 MED ORDER — IOHEXOL 350 MG/ML SOLN
80.0000 mL | Freq: Once | INTRAVENOUS | Status: AC | PRN
  Administered 2020-10-03: 80 mL via INTRAVENOUS

## 2020-10-03 MED ORDER — SODIUM CHLORIDE 0.9 % IV SOLN: 100.0000 mg | Freq: Every day | INTRAVENOUS | Status: DC

## 2020-10-03 MED ORDER — ALBUTEROL SULFATE HFA 108 (90 BASE) MCG/ACT IN AERS
2.0000 | INHALATION_SPRAY | Freq: Four times a day (QID) | RESPIRATORY_TRACT | Status: DC
  Filled 2020-10-03: qty 6.7

## 2020-10-03 MED ORDER — ZINC SULFATE 220 (50 ZN) MG PO CAPS
220.0000 mg | ORAL_CAPSULE | Freq: Every day | ORAL | Status: DC
  Filled 2020-10-03: qty 1

## 2020-10-03 MED ORDER — GUAIFENESIN-DM 100-10 MG/5ML PO SYRP
10.0000 mL | ORAL_SOLUTION | ORAL | Status: DC | PRN
  Filled 2020-10-03: qty 10

## 2020-10-03 NOTE — Discharge Instructions (Signed)

## 2020-10-03 NOTE — MAU Note (Signed)
Pt reports to mau with c/o sob and swelling in lower extremities for past 2 days.  Pt states she is unable to lay flat.

## 2020-10-03 NOTE — MAU Provider Note (Signed)
History     CSN: [id number]  Arrival date and time: 10/03/20 1102   Event Date/Time   First Provider Initiated Contact with Patient 10/03/20 1133      Chief Complaint  Patient presents with  . Shortness of Breath  . Leg Swelling   HPI Jennifer Summers is a 40 y.o. [id number] postpartum from a c/s on 2/15 who presents with shortness of breath. She was diagnosed with COVID on 2/15 when she presented for delivery but a mild cough was her only symptom. She states yesterday she noticed the worsening shortness of breath and was no longer able to sleep laying down. She feels like she is unable to take a deep breath and it feels like she is struggling to breath. She reports an intermittent headache for the last 3 days that she rates a 4/10. She denies any visual changes or epigastric pain. She denies any problems with hypertension during the pregnancy. She reports the swelling in her lower extremities is significantly worse and is concerned that she may have a blood clot somewhere due to her hx of protein S deficiency. She states she received both covid vaccines around March 2021 but did not have a booster.   OB History    Gravida  6   Para  5   Term  1   Preterm  1   AB  0   Living  5     SAB  0   IAB      Ectopic      Multiple  0   Live Births  5           Past Medical History:  Diagnosis Date  . Acid reflux   . Anxiety   . Cervical disc disorder 09/14/2017   Very minimal disc space narrowing C4-5 and C5-6 by x-ray.  . IBS (irritable bowel syndrome)   . Immunity status testing 04/16/2013   Tested positive for immunity measles/mumps/rubella and varicella.  . Iron deficiency anemia    Patient reports having to have iron/blood infusions in the past.  . Migraines   . Pancreatitis   . Placenta previa   . Prediabetes 11/08/2018  . Protein S deficiency ([place])   . Right upper quadrant abdominal pain 04/17/2017  . Vitamin D deficiency     Past Surgical History:   Procedure Laterality Date  . CESAREAN SECTION     x 4.  2007, 2008, 2009, 2010.  2007 was emergent C-section.  Marland Kitchen CESAREAN SECTION N/A 09/28/2020   Procedure: REPEAT CESAREAN SECTION EDC: 10-13-20 ALLERGIES: CODEINE;  Surgeon: Everlene Farrier, MD;  Location: [facility];  Service: Obstetrics;  Laterality: N/A;  . TONSILLECTOMY  2000  . Tubal sterilization reversal  01/2019    Family History  Problem Relation Age of Onset  . Hypertension Mother     Social History   Tobacco Use  . Smoking status: Never Smoker  . Smokeless tobacco: Never Used  Vaping Use  . Vaping Use: Never used  Substance Use Topics  . Alcohol use: No  . Drug use: No    Allergies:  Allergies  Allergen Reactions  . Codeine Other (See Comments)    Unknown childhood reaction.    Medications Prior to Admission  Medication Sig Dispense Refill Last Dose  . enoxaparin (LOVENOX) 40 MG/0.4ML injection Inject 0.4 mLs (40 mg total) into the skin daily. 10 mL 1   . ferrous sulfate (SLOW IRON) 160 (50 Fe) MG TBCR SR tablet Take 160 mg  by mouth daily.     Marland Kitchen FLUoxetine (PROZAC) 20 MG tablet TAKE 1 TABLET BY MOUTH DAILY (Patient taking differently: Take 20 mg by mouth daily.) 90 tablet 1   . hydrOXYzine (ATARAX/VISTARIL) 25 MG tablet Take 25 mg by mouth at bedtime.     Marland Kitchen ibuprofen (ADVIL) 100 MG/5ML suspension Take 30 mLs (600 mg total) by mouth every 6 (six) hours as needed for mild pain or moderate pain. 473 mL 1   . oxyCODONE (ROXICODONE) 5 MG/5ML solution Take 5 mLs (5 mg total) by mouth every 4 (four) hours as needed for up to 7 days for moderate pain. 473 mL 0   . pantoprazole (PROTONIX) 40 MG tablet TAKE 1 TABLET BY MOUTH DAILY. (Patient taking differently: Take 40 mg by mouth daily.) 90 tablet 1   . Prenatal Vit-Fe Fumarate-FA (PRENATAL MULTIVITAMIN) TABS tablet Take 1 tablet by mouth daily at 12 noon.       Review of Systems  Constitutional: Negative.  Negative for fatigue and fever.  HENT: Negative.   Respiratory:  Positive for cough, chest tightness and shortness of breath.   Cardiovascular: Positive for leg swelling. Negative for chest pain.  Gastrointestinal: Negative.  Negative for abdominal pain, constipation, diarrhea, nausea and vomiting.  Genitourinary: Positive for vaginal discharge. Negative for dysuria and vaginal bleeding.  Neurological: Positive for headaches. Negative for dizziness.   Physical Exam   Blood pressure 131/78, pulse 87, temperature 99.2 F (37.3 C), temperature source Oral, resp. rate (!) 22, last menstrual period 09/05/2019, SpO2 96 %, unknown if currently breastfeeding.  Patient Vitals for the past 24 hrs:  BP Temp Temp src Pulse Resp SpO2  10/03/20 1355 - - - - - 96 %  10/03/20 1352 - 99.2 F (37.3 C) Oral - - -  10/03/20 1350 - - - - - 96 %  10/03/20 1345 131/78 - - 87 - 98 %  10/03/20 1340 - - - - - 94 %  10/03/20 1335 - - - - - 95 %  10/03/20 1330 (!) 130/98 - - 85 - 96 %  10/03/20 1325 - - - - - 96 %  10/03/20 1320 - - - - - 96 %  10/03/20 1315 131/73 - - 94 - 97 %  10/03/20 1310 137/75 - - 90 - -  10/03/20 1237 (!) 141/75 - - (!) 108 (!) 22 -  10/03/20 1122 (!) 140/94 99 F (37.2 C) Oral (!) 107 (!) 26 -     Physical Exam Vitals and nursing note reviewed.  Constitutional:      General: She is not in acute distress.    Appearance: She is well-nourished. She is ill-appearing and diaphoretic.  HENT:     Head: Normocephalic.  Eyes:     Pupils: Pupils are equal, round, and reactive to light.  Cardiovascular:     Rate and Rhythm: Normal rate and regular rhythm.     Heart sounds: Normal heart sounds.  Pulmonary:     Effort: Pulmonary effort is normal. Tachypnea present. No respiratory distress.     Breath sounds: Normal breath sounds. No decreased breath sounds, wheezing, rhonchi or rales.  Abdominal:     General: Bowel sounds are normal. There is no distension.     Palpations: Abdomen is soft.     Tenderness: There is no abdominal tenderness.   Musculoskeletal:     Right lower leg: Edema present.     Left lower leg: Edema present.  Skin:    General:  Skin is warm.  Neurological:     Mental Status: She is alert and oriented to person, place, and time.     Motor: No abnormal muscle tone.     Coordination: Coordination normal.     Deep Tendon Reflexes: Reflexes are normal and symmetric. Reflexes normal.  Psychiatric:        Mood and Affect: Mood and affect normal.        Behavior: Behavior normal.        Thought Content: Thought content normal.        Judgment: Judgment normal.     MAU Course  Procedures Results for orders placed or performed during the hospital encounter of 10/03/20 (from the past 24 hour(s))  CBC with Differential/Platelet     Status: Abnormal   Collection Time: 10/03/20 12:15 PM  Result Value Ref Range   WBC 20.0 (H) 4.0 - 10.5 K/uL   RBC 3.73 (L) 3.87 - 5.11 MIL/uL   Hemoglobin 9.1 (L) 12.0 - 15.0 g/dL   HCT 29.5 (L) 36.0 - 46.0 %   MCV 79.1 (L) 80.0 - 100.0 fL   MCH 24.4 (L) 26.0 - 34.0 pg   MCHC 30.8 30.0 - 36.0 g/dL   RDW 21.9 (H) 11.5 - 15.5 %   Platelets 334 150 - 400 K/uL   nRBC 0.3 (H) 0.0 - 0.2 %   Neutrophils Relative % 85 %   Neutro Abs 17.1 (H) 1.7 - 7.7 K/uL   Lymphocytes Relative 5 %   Lymphs Abs 1.0 0.7 - 4.0 K/uL   Monocytes Relative 8 %   Monocytes Absolute 1.5 (H) 0.1 - 1.0 K/uL   Eosinophils Relative 1 %   Eosinophils Absolute 0.1 0.0 - 0.5 K/uL   Basophils Relative 0 %   Basophils Absolute 0.0 0.0 - 0.1 K/uL   Immature Granulocytes 1 %   Abs Immature Granulocytes 0.24 (H) 0.00 - 0.07 K/uL   Polychromasia PRESENT    Ovalocytes PRESENT   Comprehensive metabolic panel     Status: Abnormal   Collection Time: 10/03/20 12:15 PM  Result Value Ref Range   Sodium 135 135 - 145 mmol/L   Potassium 3.1 (L) 3.5 - 5.1 mmol/L   Chloride 100 98 - 111 mmol/L   CO2 25 22 - 32 mmol/L   Glucose, Bld 84 70 - 99 mg/dL   BUN 7 6 - 20 mg/dL   Creatinine, Ser 0.71 0.44 - 1.00 mg/dL    Calcium 7.9 (L) 8.9 - 10.3 mg/dL   Total Protein 5.3 (L) 6.5 - 8.1 g/dL   Albumin 2.2 (L) 3.5 - 5.0 g/dL   AST 12 (L) 15 - 41 U/L   ALT 23 0 - 44 U/L   Alkaline Phosphatase 102 38 - 126 U/L   Total Bilirubin 0.8 0.3 - 1.2 mg/dL   GFR, Estimated >60 >60 mL/min   Anion gap 10 5 - 15  Brain natriuretic peptide     Status: Abnormal   Collection Time: 10/03/20 12:15 PM  Result Value Ref Range   B Natriuretic Peptide 359.8 (H) 0.0 - 100.0 pg/mL  D-dimer, quantitative     Status: Abnormal   Collection Time: 10/03/20 12:15 PM  Result Value Ref Range   D-Dimer, Quant 4.47 (H) 0.00 - 0.50 ug/mL-FEU  Troponin I (High Sensitivity)     Status: Abnormal   Collection Time: 10/03/20 12:15 PM  Result Value Ref Range   Troponin I (High Sensitivity) 19 (H) <18 ng/L  CT Angio Chest PE W and/or Wo Contrast  Result Date: 10/03/2020 CLINICAL DATA:  Shortness of breath and bilateral lower extremity swelling for 2 days. Elevated D-dimer. EXAM: CT ANGIOGRAPHY CHEST WITH CONTRAST TECHNIQUE: Multidetector CT imaging of the chest was performed using the standard protocol during bolus administration of intravenous contrast. Multiplanar CT image reconstructions and MIPs were obtained to evaluate the vascular anatomy. CONTRAST:  80 mL OMNIPAQUE IOHEXOL 350 MG/ML SOLN COMPARISON:  Single-view of the chest today. FINDINGS: Cardiovascular: No pulmonary embolus is identified. No aneurysm. Heart size is upper normal. No pericardial effusion. Mediastinum/Nodes: No enlarged mediastinal, hilar, or axillary lymph nodes. Thyroid gland, trachea, and esophagus demonstrate no significant findings. Lungs/Pleura: No pleural effusion. There is mild dependent atelectasis. Lungs otherwise clear. Upper Abdomen: Negative. Musculoskeletal: Negative. Review of the MIP images confirms the above findings. IMPRESSION: Negative for pulmonary embolus.  Negative chest CT. Electronically Signed   By: Inge Rise M.D.   On: 10/03/2020 13:15    DG Chest Portable 1 View  Result Date: 10/03/2020 CLINICAL DATA:  Shortness of breath and leg swelling. EXAM: PORTABLE CHEST 1 VIEW COMPARISON:  None. FINDINGS: The cardiomediastinal silhouette is unremarkable. Mild pulmonary vascular congestion is noted. Opacities/atelectasis in the MEDIAL RIGHT LOWER lung noted. No pleural effusion or pneumothorax noted. No acute bony abnormalities are present. IMPRESSION: RIGHT LOWER lung opacities versus atelectasis. This may represent infection/pneumonia. Mild pulmonary vascular congestion. Electronically Signed   By: Margarette Canada M.D.   On: 10/03/2020 11:49   MDM CBC with Diff CMP BNP Troponin D Dimer Saline lock Chest Xray PO tylenol  CT angio Chest PE Patient able to ambulate around room while maintaining O2 saturations >96%  Consulted with Dr. Tamala Julian from [facility]- will come see and assess patient   Dr. Tamala Julian placed orders for labs and a dose of Remdesivir while in MAU. Patient desires discharge home and Dr. Tamala Julian is agreeable due to patient's ability to maintain O2 sats and keep outpatient follow up.   Dr. Lynnette Caffey made aware of patient assessment, new onset hypertension and plans for outpatient management. Dr. Lynnette Caffey will have office call patient to assess BP this week.   Assessment and Plan   1. Postpartum hypertension   2. COVID-19    -Discharge home in stable condition -Rx for Norvasc sent to patient's pharmacy -COVID precautions discussed -Patient advised to follow-up with outpatient treatment team for completion of remdesivir infusion. OB office to follow up this week.  -Patient may return to MAU as needed or if her condition were to change or worsen   Wende Mott CNM 10/03/2020, 2:47 PM

## 2020-10-03 NOTE — Consult Note (Addendum)
[facility] Medical Consultation  Jennifer Summers JJK:[id number] DOB: [birth year] DOA: 10/03/2020 PCP: Midge Minium, MD   Requesting physician: OB/GYN Date of consultation: 10/03/2020 Reason for consultation: Covid Pna  Impression/Recommendations   1. Status post C-section: Patient status post day 5 from C-section.  2. SIRS: Patient presents with tachycardia and tachypnea with white blood cell count elevated at 20. Vital signs appear to have stabilized without any acute intervention. -Check blood cultures -Check lactic acid level  3. Acute respiratory distress secondary to COVID-19: Acute. Presents with complaints of worsening shortness of breath. Initially diagnosed with COVID-19 on 2/14. Patient was not noted to be hypoxic on room air. Initial chest x-ray concerning for atelectasis versus pneumonia. However subsequent CT angiogram of the chest negative for any signs of a pulmonary embolus or pneumonia, but did note mild dependent atelectasis. She appears clinically stable and suspect that it would be safe to discharge home.  -Airborne precaution precautions -Check to make sure patient oxygenation stays above 90% with ambulation -Remdesivir IV x1 dose of 3, but patient can be set up to receive infusions in outpatient setting x 2 more doses.(Would need to place ambulatory referral for COVID treatment) -Check inflammatory markers and monitor daily if patient is formally admitted -Albuterol inhaler every 6 hours as needed for shortness of breath/wheeze(would make sure patient receives the inhaler and received some instruction on how to appropriately use) -Antitussives as needed -Vitamin C and zinc -Recommend patient obtain a pulse oximetry monitor to check oxygenation at home if able to be discharged. If oxygenation dropping lower than 90% at rest or with ambulation patient should return to the hospital to be formally admitted  4. Lower extremity edema and elevated D-dimer  history of protein S deficiency: Patient with at least 2-3+ pitting edema bilateral lower extremities causing her to have pain. D-dimer was elevated at 4.47. Other possible causes for lower extremity edema would be patient's hypoalbuminemia. -Check Doppler duplex lower extremities given the elevation of the D-dimer and history of protein S deficiency which would put her at increased risk for clot -Check TSH -May consider getting a short course of Lasix due to patient's complaints of significant pain   5. Elevated BNP and troponin: Acute. BNP was elevated up to 359.8 and initial troponin 19, but no clear signs of edema on imaging and heart size appears stable. Low suspicion for postpartum cardiomyopathy. Patient denies any reports of chest pain.  6. Microcytic anemia: Chronic. Hemoglobin 9.1 g/dL which is similar to 2/16. Patient should be on iron supplementation most likely. -Continue outpatient follow-up with primary care provider  7. Hypokalemia: Acute. Patient's initial potassium noted to be mildly low at 3.1. -Give 40 mEq of potassium chloride x1 dose -Recommend patient follow-up with primary care provider to  I will followup again tomorrow. Please contact me if I can be of assistance in the meanwhile. Thank you for this consultation.  Chief Complaint: Shortness of breath and leg swelling  HPI:  Jennifer Summers is a 40 y.o. female with medical history significant of protein S deficiency and postpartum day 5 from her fifth C-section who presents with shortness of breath worsening lower extremity swelling. Patient had initially been diagnosed with COVID-19 2/14 prior to her C-section, but had not had symptoms at that time. Since the C-section patient reports that she has had a progressively worsening swelling of her lower extremities that is a little worse than previously in the past causing her to have pain in her legs. Shortness of breath  symptoms were worsened with any kind of exertion. Notes  associated symptoms of malaise, intermittent cough, and pain from C-section wound. Denies any fever, redness, and/or drainage from the C-section wound.  Patient was afebrile with pulse 85-1 08, respiration 22-26, and all other vital signs currently maintained. Labs were significant for WBC 20, hemoglobin 9.1, potassium 3.1, calcium 7.9, albumin 2.2, BNP 359.8, D-dimer 4.47 and troponin 19. CT a of the chest is negative for any signs of a PE and noted some mild dependent atelectasis on the right lung.    Review of Systems: Review of Systems  Constitutional: Positive for chills, fever and malaise/fatigue.  Respiratory: Positive for cough and shortness of breath.   Cardiovascular: Positive for leg swelling. Negative for chest pain.  Gastrointestinal: Positive for abdominal pain. Negative for vomiting.  Genitourinary: Negative for dysuria.  Skin: Negative for rash.     Past Medical History:  Diagnosis Date  . Acid reflux   . Anxiety   . Cervical disc disorder 09/14/2017   Very minimal disc space narrowing C4-5 and C5-6 by x-ray.  . IBS (irritable bowel syndrome)   . Immunity status testing 04/16/2013   Tested positive for immunity measles/mumps/rubella and varicella.  . Iron deficiency anemia    Patient reports having to have iron/blood infusions in the past.  . Migraines   . Pancreatitis   . Placenta previa   . Prediabetes 11/08/2018  . Protein S deficiency ([place])   . Right upper quadrant abdominal pain 04/17/2017  . Vitamin D deficiency    Past Surgical History:  Procedure Laterality Date  . CESAREAN SECTION     x 4.  2007, 2008, 2009, 2010.  2007 was emergent C-section.  Marland Kitchen CESAREAN SECTION N/A 09/28/2020   Procedure: REPEAT CESAREAN SECTION EDC: 10-13-20 ALLERGIES: CODEINE;  Surgeon: Everlene Farrier, MD;  Location: [facility];  Service: Obstetrics;  Laterality: N/A;  . TONSILLECTOMY  2000  . Tubal sterilization reversal  01/2019   Social History:  reports that she has never smoked.  She has never used smokeless tobacco. She reports that she does not drink alcohol and does not use drugs.  Allergies  Allergen Reactions  . Codeine Other (See Comments)    Unknown childhood reaction.   Family History  Problem Relation Age of Onset  . Hypertension Mother     Prior to Admission medications   Medication Sig Start Date End Date Taking? Authorizing Provider  enoxaparin (LOVENOX) 40 MG/0.4ML injection Inject 0.4 mLs (40 mg total) into the skin daily. 09/30/20   Louretta Shorten, MD  ferrous sulfate (SLOW IRON) 160 (50 Fe) MG TBCR SR tablet Take 160 mg by mouth daily.    [provider]  FLUoxetine (PROZAC) 20 MG tablet TAKE 1 TABLET BY MOUTH DAILY Patient taking differently: Take 20 mg by mouth daily. 07/12/20   Brunetta Jeans, PA-C  hydrOXYzine (ATARAX/VISTARIL) 25 MG tablet Take 25 mg by mouth at bedtime.    [provider]  ibuprofen (ADVIL) 100 MG/5ML suspension Take 30 mLs (600 mg total) by mouth every 6 (six) hours as needed for mild pain or moderate pain. 09/30/20   Louretta Shorten, MD  oxyCODONE (ROXICODONE) 5 MG/5ML solution Take 5 mLs (5 mg total) by mouth every 4 (four) hours as needed for up to 7 days for moderate pain. 09/30/20 10/07/20  Louretta Shorten, MD  pantoprazole (PROTONIX) 40 MG tablet TAKE 1 TABLET BY MOUTH DAILY. Patient taking differently: Take 40 mg by mouth daily. 04/13/20  Brunetta Jeans, PA-C  Prenatal Vit-Fe Fumarate-FA (PRENATAL MULTIVITAMIN) TABS tablet Take 1 tablet by mouth daily at 12 noon.    [provider]   Physical Exam:  Constitutional: Middle-aged female who appears to be fatigue Vitals:   10/03/20 1345 10/03/20 1350 10/03/20 1352 10/03/20 1355  BP: 131/78     Pulse: 87     Resp:      Temp:   99.2 F (37.3 C)   TempSrc:   Oral   SpO2: 98% 96%  96%   Eyes: PERRL, lids and conjunctivae normal Neck: normal, supple, no masses, no thyromegaly. No JVD. Respiratory: Normal respiratory effort Cardiovascular: Regular  rate and rhythm, no murmurs / rubs / gallops. 2-3+ pitting bilateral extremity edema. 2+ pedal pulses. No carotid bruits.  Abdomen: no tenderness, no masses palpated. No hepatosplenomegaly. Bowel sounds positive.  Musculoskeletal: no clubbing / cyanosis. No joint deformity upper and lower extremities. Good ROM, no contractures. Normal muscle tone.  Skin: no rashes, lesions, ulcers. No induration Neurologic: CN 2-12 grossly intact. Sensation intact, DTR normal. Strength 5/5 in all 4.  Psychiatric: Normal judgment and insight. Alert and oriented x 3. Normal mood.   Labs on Admission:  Basic Metabolic Panel: Recent Labs  Lab 09/27/20 0920 10/03/20 1215  NA  --  135  K  --  3.1*  CL  --  100  CO2  --  25  GLUCOSE  --  84  BUN  --  7  CREATININE 0.81 0.71  CALCIUM  --  7.9*   Liver Function Tests: Recent Labs  Lab 10/03/20 1215  AST 12*  ALT 23  ALKPHOS 102  BILITOT 0.8  PROT 5.3*  ALBUMIN 2.2*   No results for input(s): LIPASE, AMYLASE in the last 168 hours. No results for input(s): AMMONIA in the last 168 hours. CBC: Recent Labs  Lab 09/27/20 0920 09/29/20 0535 10/03/20 1215  WBC 6.7 11.2* 20.0*  NEUTROABS  --   --  17.1*  HGB 11.2* 9.1* 9.1*  HCT 35.0* 28.2* 29.5*  MCV 77.3* 77.5* 79.1*  PLT 325 280 334   Cardiac Enzymes: No results for input(s): CKTOTAL, CKMB, CKMBINDEX, TROPONINI in the last 168 hours. BNP: Invalid input(s): POCBNP CBG: No results for input(s): GLUCAP in the last 168 hours.  Radiological Exams on Admission: CT Angio Chest PE W and/or Wo Contrast  Result Date: 10/03/2020 CLINICAL DATA:  Shortness of breath and bilateral lower extremity swelling for 2 days. Elevated D-dimer. EXAM: CT ANGIOGRAPHY CHEST WITH CONTRAST TECHNIQUE: Multidetector CT imaging of the chest was performed using the standard protocol during bolus administration of intravenous contrast. Multiplanar CT image reconstructions and MIPs were obtained to evaluate the vascular  anatomy. CONTRAST:  80 mL OMNIPAQUE IOHEXOL 350 MG/ML SOLN COMPARISON:  Single-view of the chest today. FINDINGS: Cardiovascular: No pulmonary embolus is identified. No aneurysm. Heart size is upper normal. No pericardial effusion. Mediastinum/Nodes: No enlarged mediastinal, hilar, or axillary lymph nodes. Thyroid gland, trachea, and esophagus demonstrate no significant findings. Lungs/Pleura: No pleural effusion. There is mild dependent atelectasis. Lungs otherwise clear. Upper Abdomen: Negative. Musculoskeletal: Negative. Review of the MIP images confirms the above findings. IMPRESSION: Negative for pulmonary embolus.  Negative chest CT. Electronically Signed   By: Inge Rise M.D.   On: 10/03/2020 13:15   DG Chest Portable 1 View  Result Date: 10/03/2020 CLINICAL DATA:  Shortness of breath and leg swelling. EXAM: PORTABLE CHEST 1 VIEW COMPARISON:  None. FINDINGS: The cardiomediastinal silhouette is unremarkable. Mild  pulmonary vascular congestion is noted. Opacities/atelectasis in the MEDIAL RIGHT LOWER lung noted. No pleural effusion or pneumothorax noted. No acute bony abnormalities are present. IMPRESSION: RIGHT LOWER lung opacities versus atelectasis. This may represent infection/pneumonia. Mild pulmonary vascular congestion. Electronically Signed   By: Margarette Canada M.D.   On: 10/03/2020 11:49      Time spent: >45 minutes  [facility]   If 7PM-7AM, please contact night-coverage

## 2020-10-03 NOTE — Progress Notes (Signed)
VASCULAR LAB    Bilateral lower extremity venous duplex has been performed.  See CV proc for preliminary results.   Efrem Pitstick, RVT 10/03/2020, 4:50 PM

## 2020-10-04 ENCOUNTER — Inpatient Hospital Stay (HOSPITAL_COMMUNITY)
Admission: AD | Admit: 2020-10-04 | Discharge: 2020-10-06 | DRG: 776 | Discharge status: Against Medical Advice | Payer: No Typology Code available for payment source | Attending: Internal Medicine | Admitting: Internal Medicine

## 2020-10-04 ENCOUNTER — Inpatient Hospital Stay (HOSPITAL_COMMUNITY): Payer: No Typology Code available for payment source

## 2020-10-04 ENCOUNTER — Encounter (HOSPITAL_COMMUNITY): Payer: Self-pay | Admitting: Obstetrics and Gynecology

## 2020-10-04 ENCOUNTER — Other Ambulatory Visit: Payer: Self-pay

## 2020-10-04 ENCOUNTER — Telehealth: Payer: Self-pay | Admitting: Family Medicine

## 2020-10-04 ENCOUNTER — Telehealth (INDEPENDENT_AMBULATORY_CARE_PROVIDER_SITE_OTHER): Payer: No Typology Code available for payment source | Admitting: Family Medicine

## 2020-10-04 ENCOUNTER — Encounter: Payer: Self-pay | Admitting: Family Medicine

## 2020-10-04 DIAGNOSIS — R0602 Shortness of breath: Secondary | ICD-10-CM

## 2020-10-04 DIAGNOSIS — O99285 Endocrine, nutritional and metabolic diseases complicating the puerperium: Secondary | ICD-10-CM

## 2020-10-04 DIAGNOSIS — D509 Iron deficiency anemia, unspecified: Secondary | ICD-10-CM | POA: Diagnosis present

## 2020-10-04 DIAGNOSIS — R600 Localized edema: Secondary | ICD-10-CM | POA: Diagnosis not present

## 2020-10-04 DIAGNOSIS — R0603 Acute respiratory distress: Secondary | ICD-10-CM | POA: Diagnosis present

## 2020-10-04 DIAGNOSIS — R6510 Systemic inflammatory response syndrome (SIRS) of non-infectious origin without acute organ dysfunction: Secondary | ICD-10-CM | POA: Diagnosis present

## 2020-10-04 DIAGNOSIS — D6859 Other primary thrombophilia: Secondary | ICD-10-CM | POA: Diagnosis present

## 2020-10-04 DIAGNOSIS — U071 COVID-19: Secondary | ICD-10-CM | POA: Diagnosis not present

## 2020-10-04 DIAGNOSIS — E8779 Other fluid overload: Secondary | ICD-10-CM

## 2020-10-04 DIAGNOSIS — O9953 Diseases of the respiratory system complicating the puerperium: Secondary | ICD-10-CM

## 2020-10-04 DIAGNOSIS — O864 Pyrexia of unknown origin following delivery: Secondary | ICD-10-CM | POA: Diagnosis present

## 2020-10-04 DIAGNOSIS — O9853 Other viral diseases complicating the puerperium: Principal | ICD-10-CM | POA: Diagnosis present

## 2020-10-04 DIAGNOSIS — R7989 Other specified abnormal findings of blood chemistry: Secondary | ICD-10-CM | POA: Diagnosis present

## 2020-10-04 DIAGNOSIS — Z5329 Procedure and treatment not carried out because of patient's decision for other reasons: Secondary | ICD-10-CM | POA: Diagnosis present

## 2020-10-04 DIAGNOSIS — I110 Hypertensive heart disease with heart failure: Secondary | ICD-10-CM | POA: Diagnosis present

## 2020-10-04 DIAGNOSIS — E8809 Other disorders of plasma-protein metabolism, not elsewhere classified: Secondary | ICD-10-CM | POA: Diagnosis present

## 2020-10-04 DIAGNOSIS — O9081 Anemia of the puerperium: Secondary | ICD-10-CM | POA: Diagnosis present

## 2020-10-04 DIAGNOSIS — Z98891 History of uterine scar from previous surgery: Secondary | ICD-10-CM

## 2020-10-04 DIAGNOSIS — E876 Hypokalemia: Secondary | ICD-10-CM | POA: Diagnosis present

## 2020-10-04 DIAGNOSIS — I5021 Acute systolic (congestive) heart failure: Secondary | ICD-10-CM | POA: Diagnosis present

## 2020-10-04 DIAGNOSIS — R7303 Prediabetes: Secondary | ICD-10-CM | POA: Diagnosis present

## 2020-10-04 DIAGNOSIS — R778 Other specified abnormalities of plasma proteins: Secondary | ICD-10-CM | POA: Diagnosis not present

## 2020-10-04 DIAGNOSIS — J810 Acute pulmonary edema: Secondary | ICD-10-CM | POA: Diagnosis not present

## 2020-10-04 DIAGNOSIS — E8770 Fluid overload, unspecified: Secondary | ICD-10-CM | POA: Diagnosis present

## 2020-10-04 DIAGNOSIS — O99893 Other specified diseases and conditions complicating puerperium: Secondary | ICD-10-CM | POA: Diagnosis present

## 2020-10-04 DIAGNOSIS — Z8249 Family history of ischemic heart disease and other diseases of the circulatory system: Secondary | ICD-10-CM

## 2020-10-04 LAB — CBC WITH DIFFERENTIAL/PLATELET
Abs Immature Granulocytes: 0 10*3/uL (ref 0.00–0.07)
Basophils Absolute: 0 10*3/uL (ref 0.0–0.1)
Basophils Relative: 0 %
Eosinophils Absolute: 0.3 10*3/uL (ref 0.0–0.5)
Eosinophils Relative: 2 %
HCT: 30.9 % — ABNORMAL LOW (ref 36.0–46.0)
Hemoglobin: 9.4 g/dL — ABNORMAL LOW (ref 12.0–15.0)
Lymphocytes Relative: 6 %
Lymphs Abs: 0.8 10*3/uL (ref 0.7–4.0)
MCH: 24 pg — ABNORMAL LOW (ref 26.0–34.0)
MCHC: 30.4 g/dL (ref 30.0–36.0)
MCV: 78.8 fL — ABNORMAL LOW (ref 80.0–100.0)
Monocytes Absolute: 0.5 10*3/uL (ref 0.1–1.0)
Monocytes Relative: 4 %
Neutro Abs: 11.8 10*3/uL — ABNORMAL HIGH (ref 1.7–7.7)
Neutrophils Relative %: 88 %
Platelets: 359 10*3/uL (ref 150–400)
RBC: 3.92 MIL/uL (ref 3.87–5.11)
RDW: 21.5 % — ABNORMAL HIGH (ref 11.5–15.5)
WBC: 13.4 10*3/uL — ABNORMAL HIGH (ref 4.0–10.5)
nRBC: 0 /100 WBC
nRBC: 0.4 % — ABNORMAL HIGH (ref 0.0–0.2)

## 2020-10-04 LAB — CBC
HCT: 28.8 % — ABNORMAL LOW (ref 36.0–46.0)
Hemoglobin: 8.9 g/dL — ABNORMAL LOW (ref 12.0–15.0)
MCH: 24.3 pg — ABNORMAL LOW (ref 26.0–34.0)
MCHC: 30.9 g/dL (ref 30.0–36.0)
MCV: 78.5 fL — ABNORMAL LOW (ref 80.0–100.0)
Platelets: 375 10*3/uL (ref 150–400)
RBC: 3.67 MIL/uL — ABNORMAL LOW (ref 3.87–5.11)
RDW: 21.5 % — ABNORMAL HIGH (ref 11.5–15.5)
WBC: 14.3 10*3/uL — ABNORMAL HIGH (ref 4.0–10.5)
nRBC: 0.3 % — ABNORMAL HIGH (ref 0.0–0.2)

## 2020-10-04 LAB — C-REACTIVE PROTEIN
CRP: 18.9 mg/dL — ABNORMAL HIGH (ref <1.0)
CRP: 19.6 mg/dL — ABNORMAL HIGH (ref <1.0)

## 2020-10-04 LAB — COMPREHENSIVE METABOLIC PANEL
ALT: 18 U/L (ref 0–44)
AST: 11 U/L — ABNORMAL LOW (ref 15–41)
Albumin: 2.1 g/dL — ABNORMAL LOW (ref 3.5–5.0)
Alkaline Phosphatase: 127 U/L — ABNORMAL HIGH (ref 38–126)
Anion gap: 11 (ref 5–15)
BUN: 7 mg/dL (ref 6–20)
CO2: 25 mmol/L (ref 22–32)
Calcium: 8 mg/dL — ABNORMAL LOW (ref 8.9–10.3)
Chloride: 103 mmol/L (ref 98–111)
Creatinine, Ser: 0.75 mg/dL (ref 0.44–1.00)
GFR, Estimated: >60 mL/min (ref >60)
Glucose, Bld: 87 mg/dL (ref 70–99)
Potassium: 2.8 mmol/L — ABNORMAL LOW (ref 3.5–5.1)
Sodium: 139 mmol/L (ref 135–145)
Total Bilirubin: 0.8 mg/dL (ref 0.3–1.2)
Total Protein: 5.3 g/dL — ABNORMAL LOW (ref 6.5–8.1)

## 2020-10-04 LAB — D-DIMER, QUANTITATIVE: D-Dimer, Quant: 5.12 ug/mL-FEU — ABNORMAL HIGH (ref 0.00–0.50)

## 2020-10-04 LAB — FIBRINOGEN: Fibrinogen: 520 mg/dL — ABNORMAL HIGH (ref 210–475)

## 2020-10-04 LAB — FERRITIN: Ferritin: 75 ng/mL (ref 11–307)

## 2020-10-04 LAB — LACTATE DEHYDROGENASE: LDH: 259 U/L — ABNORMAL HIGH (ref 98–192)

## 2020-10-04 LAB — BRAIN NATRIURETIC PEPTIDE: B Natriuretic Peptide: 268 pg/mL — ABNORMAL HIGH (ref 0.0–100.0)

## 2020-10-04 LAB — TROPONIN I (HIGH SENSITIVITY)
Troponin I (High Sensitivity): 25 ng/L — ABNORMAL HIGH (ref <18)
Troponin I (High Sensitivity): 20 ng/L — ABNORMAL HIGH (ref <18)

## 2020-10-04 MED ORDER — ALBUTEROL SULFATE HFA 108 (90 BASE) MCG/ACT IN AERS
2.0000 | INHALATION_SPRAY | RESPIRATORY_TRACT | Status: DC | PRN
  Filled 2020-10-04: qty 6.7

## 2020-10-04 MED ORDER — GUAIFENESIN-DM 100-10 MG/5ML PO SYRP: 10.0000 mL | ORAL_SOLUTION | ORAL | Status: DC | PRN

## 2020-10-04 MED ORDER — LABETALOL HCL 5 MG/ML IV SOLN: 80.0000 mg | INTRAVENOUS | Status: DC | PRN

## 2020-10-04 MED ORDER — LACTATED RINGERS IV SOLN: INTRAVENOUS | Status: DC

## 2020-10-04 MED ORDER — HYDRALAZINE HCL 20 MG/ML IJ SOLN: 10.0000 mg | INTRAMUSCULAR | Status: DC | PRN

## 2020-10-04 MED ORDER — LABETALOL HCL 5 MG/ML IV SOLN: 20.0000 mg | INTRAVENOUS | Status: DC | PRN

## 2020-10-04 MED ORDER — POTASSIUM CITRATE-CITRIC ACID 1100-334 MG/5ML PO SOLN: 20.0000 meq | Freq: Once | ORAL | Status: DC

## 2020-10-04 MED ORDER — SODIUM CHLORIDE 0.9 % IV SOLN: 200.0000 mg | Freq: Once | INTRAVENOUS | Status: DC

## 2020-10-04 MED ORDER — SODIUM CHLORIDE 0.9 % IV SOLN
100.0000 mg | INTRAVENOUS | Status: DC
  Administered 2020-10-04 – 2020-10-05 (×2): 100 mg via INTRAVENOUS
  Filled 2020-10-04 (×4): qty 20

## 2020-10-04 MED ORDER — SODIUM CHLORIDE 0.9 % IV SOLN: 100.0000 mg | Freq: Every day | INTRAVENOUS | Status: DC

## 2020-10-04 MED ORDER — ENOXAPARIN SODIUM 40 MG/0.4ML ~~LOC~~ SOLN
40.0000 mg | SUBCUTANEOUS | Status: DC
  Administered 2020-10-04 – 2020-10-05 (×2): 40 mg via SUBCUTANEOUS
  Filled 2020-10-04 (×2): qty 0.4

## 2020-10-04 MED ORDER — FUROSEMIDE 10 MG/ML IJ SOLN: 20.0000 mg | Freq: Once | INTRAMUSCULAR | Status: DC

## 2020-10-04 MED ORDER — LABETALOL HCL 5 MG/ML IV SOLN: 40.0000 mg | INTRAVENOUS | Status: DC | PRN

## 2020-10-04 MED ORDER — POTASSIUM CHLORIDE CRYS ER 20 MEQ PO TBCR
40.0000 meq | EXTENDED_RELEASE_TABLET | Freq: Once | ORAL | Status: DC
  Filled 2020-10-04: qty 2

## 2020-10-04 MED ORDER — FUROSEMIDE 40 MG PO TABS
40.0000 mg | ORAL_TABLET | Freq: Once | ORAL | Status: AC
  Administered 2020-10-04: 40 mg via ORAL
  Filled 2020-10-04: qty 1

## 2020-10-04 MED ORDER — POTASSIUM CHLORIDE 20 MEQ PO PACK
40.0000 meq | PACK | Freq: Once | ORAL | Status: AC
  Administered 2020-10-04: 40 meq via ORAL
  Filled 2020-10-04: qty 2

## 2020-10-04 NOTE — H&P (Signed)
Jennifer Summers is an 40 y.o. female. S/P cesarean section #5 on 09/28/20.  She was known Covid positive but asymptomatic at that time and discharged home doing well. She presents to MAU yesterday with a C/O SOB when lying down and with activity for about 2 days. She has a Protein S deficiency and is on Lovenox 40mg  QD. Evaluation yesterday included a CTA negative for PE, portable CXR with atalectasis right base and normal cardiac silhouette, dopplers of bilateral LE negative for DVT. Dr  Tamala Julian of hospitalist team consulted and started IV remdesivir. Today she had a tele visit with PCP and C/O continued SOB and noted an oxygen saturation at home of 82% while sleeping. She was instructed to return to MAU. She continues to feel SOB when lying down or walking to BR. Some cough, no fever, no chest pain.  Pertinent Gynecological History: Menses: lochia Bleeding:  Contraception:  DES exposure:  Blood transfusions:  Sexually transmitted diseases:  Previous GYN Procedures:   Last mammogram:  Date:  Last pap:  Date:  OB History: G6, P5   Menstrual History: Menarche age: [age]kn[age] No LMP recorded.    Past Medical History:  Diagnosis Date  . Acid reflux   . Anxiety   . Cervical disc disorder 09/14/2017   Very minimal disc space narrowing C4-5 and C5-6 by x-ray.  . IBS (irritable bowel syndrome)   . Immunity status testing 04/16/2013   Tested positive for immunity measles/mumps/rubella and varicella.  . Iron deficiency anemia    Patient reports having to have iron/blood infusions in the past.  . Migraines   . Pancreatitis   . Placenta previa   . Prediabetes 11/08/2018  . Protein S deficiency ([place])   . Right upper quadrant abdominal pain 04/17/2017  . Vitamin D deficiency     Past Surgical History:  Procedure Laterality Date  . CESAREAN SECTION     x 4.  2007, 2008, 2009, 2010.  2007 was emergent C-section.  Marland Kitchen CESAREAN SECTION N/A 09/28/2020   Procedure: REPEAT CESAREAN SECTION EDC: 10-13-20  ALLERGIES: CODEINE;  Surgeon: Everlene Farrier, MD;  Location: [place] [facility];  Service: Obstetrics;  Laterality: N/A;  . TONSILLECTOMY  2000  . Tubal sterilization reversal  01/2019    Family History  Problem Relation Age of Onset  . Hypertension Mother     Social History:  reports that she has never smoked. She has never used smokeless tobacco. She reports that she does not drink alcohol and does not use drugs.  Allergies:  Allergies  Allergen Reactions  . Codeine Other (See Comments)    Unknown childhood reaction.    Medications Prior to Admission  Medication Sig Dispense Refill Last Dose  . enoxaparin (LOVENOX) 40 MG/0.4ML injection Inject 0.4 mLs (40 mg total) into the skin daily. 10 mL 1 10/03/2020 at 2030  . ferrous sulfate (SLOW IRON) 160 (50 Fe) MG TBCR SR tablet Take 160 mg by mouth daily.   10/03/2020 at Unknown time  . ibuprofen (ADVIL) 100 MG/5ML suspension Take 30 mLs (600 mg total) by mouth every 6 (six) hours as needed for mild pain or moderate pain. 473 mL 1 10/04/2020 at 1200  . oxyCODONE (ROXICODONE) 5 MG/5ML solution Take 5 mLs (5 mg total) by mouth every 4 (four) hours as needed for up to 7 days for moderate pain. 473 mL 0 10/03/2020 at Unknown time  . Prenatal Vit-Fe Fumarate-FA (PRENATAL MULTIVITAMIN) TABS tablet Take 1 tablet by mouth daily at 12 noon.  Past Week at Unknown time  . amLODipine (NORVASC) 5 MG tablet Take 1 tablet (5 mg total) by mouth daily. 30 tablet 0   . FLUoxetine (PROZAC) 20 MG tablet TAKE 1 TABLET BY MOUTH DAILY (Patient taking differently: Take 20 mg by mouth daily.) 90 tablet 1   . hydrOXYzine (ATARAX/VISTARIL) 25 MG tablet Take 25 mg by mouth at bedtime.     . pantoprazole (PROTONIX) 40 MG tablet TAKE 1 TABLET BY MOUTH DAILY. (Patient taking differently: Take 40 mg by mouth daily.) 90 tablet 1     Review of Systems  Respiratory: Negative for shortness of breath.     Blood pressure 140/80, pulse 86, temperature 99.5 F (37.5 C), temperature  source Oral, resp. rate 18, SpO2 100 %, unknown if currently breastfeeding. Physical Exam  NAD sitting up in bed  Skin warm and dry Cor RRR Pulmonary Normal effort,Bilateral rales at bases Abdomen soft, midline incision healing well, staples in place LE  3+ pitting edema below knees bilat  Results for orders placed or performed during the hospital encounter of 10/04/20 (from the past 24 hour(s))  CBC     Status: Abnormal   Collection Time: 10/04/20  4:20 PM  Result Value Ref Range   WBC 14.3 (H) 4.0 - 10.5 K/uL   RBC 3.67 (L) 3.87 - 5.11 MIL/uL   Hemoglobin 8.9 (L) 12.0 - 15.0 g/dL   HCT 28.8 (L) 36.0 - 46.0 %   MCV 78.5 (L) 80.0 - 100.0 fL   MCH 24.3 (L) 26.0 - 34.0 pg   MCHC 30.9 30.0 - 36.0 g/dL   RDW 21.5 (H) 11.5 - 15.5 %   Platelets 375 150 - 400 K/uL   nRBC 0.3 (H) 0.0 - 0.2 %  Comprehensive metabolic panel     Status: Abnormal   Collection Time: 10/04/20  4:20 PM  Result Value Ref Range   Sodium 139 135 - 145 mmol/L   Potassium 2.8 (L) 3.5 - 5.1 mmol/L   Chloride 103 98 - 111 mmol/L   CO2 25 22 - 32 mmol/L   Glucose, Bld 87 70 - 99 mg/dL   BUN 7 6 - 20 mg/dL   Creatinine, Ser 0.75 0.44 - 1.00 mg/dL   Calcium 8.0 (L) 8.9 - 10.3 mg/dL   Total Protein 5.3 (L) 6.5 - 8.1 g/dL   Albumin 2.1 (L) 3.5 - 5.0 g/dL   AST 11 (L) 15 - 41 U/L   ALT 18 0 - 44 U/L   Alkaline Phosphatase 127 (H) 38 - 126 U/L   Total Bilirubin 0.8 0.3 - 1.2 mg/dL   GFR, Estimated >60 >60 mL/min   Anion gap 11 5 - 15  Brain natriuretic peptide     Status: Abnormal   Collection Time: 10/04/20  4:20 PM  Result Value Ref Range   B Natriuretic Peptide 268.0 (H) 0.0 - 100.0 pg/mL  D-dimer, quantitative     Status: Abnormal   Collection Time: 10/04/20  4:20 PM  Result Value Ref Range   D-Dimer, Quant 5.12 (H) 0.00 - 0.50 ug/mL-FEU  Troponin I (High Sensitivity)     Status: Abnormal   Collection Time: 10/04/20  4:20 PM  Result Value Ref Range   Troponin I (High Sensitivity) 25 (H) <18 ng/L   C-reactive protein     Status: Abnormal   Collection Time: 10/04/20  4:20 PM  Result Value Ref Range   CRP 18.9 (H) <1.0 mg/dL  Troponin I (High Sensitivity)  Status: Abnormal   Collection Time: 10/04/20  6:24 PM  Result Value Ref Range   Troponin I (High Sensitivity) 20 (H) <18 ng/L    CT Angio Chest PE W and/or Wo Contrast  Result Date: 10/03/2020 CLINICAL DATA:  Shortness of breath and bilateral lower extremity swelling for 2 days. Elevated D-dimer. EXAM: CT ANGIOGRAPHY CHEST WITH CONTRAST TECHNIQUE: Multidetector CT imaging of the chest was performed using the standard protocol during bolus administration of intravenous contrast. Multiplanar CT image reconstructions and MIPs were obtained to evaluate the vascular anatomy. CONTRAST:  80 mL OMNIPAQUE IOHEXOL 350 MG/ML SOLN COMPARISON:  Single-view of the chest today. FINDINGS: Cardiovascular: No pulmonary embolus is identified. No aneurysm. Heart size is upper normal. No pericardial effusion. Mediastinum/Nodes: No enlarged mediastinal, hilar, or axillary lymph nodes. Thyroid gland, trachea, and esophagus demonstrate no significant findings. Lungs/Pleura: No pleural effusion. There is mild dependent atelectasis. Lungs otherwise clear. Upper Abdomen: Negative. Musculoskeletal: Negative. Review of the MIP images confirms the above findings. IMPRESSION: Negative for pulmonary embolus.  Negative chest CT. Electronically Signed   By: Inge Rise M.D.   On: 10/03/2020 13:15   DG CHEST PORT 1 VIEW  Result Date: 10/04/2020 CLINICAL DATA:  Shortness of breath COVID September 27, 2020 EXAM: PORTABLE CHEST 1 VIEW COMPARISON:  Chest radiograph October 03, 2020 and CT a chest October 03, 2020 FINDINGS: The heart size and mediastinal contours are unchanged. Right basilar platelike atelectasis. No focal consolidation. No pleural effusion. No visible pneumothorax. The visualized skeletal structures are unchanged. IMPRESSION: Right basilar platelike  atelectasis, no new focal consolidation. The. Electronically Signed   By: Dahlia Bailiff MD   On: 10/04/2020 16:14   DG Chest Portable 1 View  Result Date: 10/03/2020 CLINICAL DATA:  Shortness of breath and leg swelling. EXAM: PORTABLE CHEST 1 VIEW COMPARISON:  None. FINDINGS: The cardiomediastinal silhouette is unremarkable. Mild pulmonary vascular congestion is noted. Opacities/atelectasis in the MEDIAL RIGHT LOWER lung noted. No pleural effusion or pneumothorax noted. No acute bony abnormalities are present. IMPRESSION: RIGHT LOWER lung opacities versus atelectasis. This may represent infection/pneumonia. Mild pulmonary vascular congestion. Electronically Signed   By: Margarette Canada M.D.   On: 10/03/2020 11:49   VAS [place] LOWER EXTREMITY VENOUS (DVT)  Result Date: 10/04/2020  Lower Venous DVT Study Indications: Swelling, SOB, and Covid-19, history of Protein S deficiency.  Risk Factors: Surgery C-Section 09/28/20. Limitations: Edema, pain from staples. Comparison Study: No prior study Performing Technologist: Sharion Dove RVS  Examination Guidelines: A complete evaluation includes B-mode imaging, spectral Doppler, color Doppler, and power Doppler as needed of all accessible portions of each vessel. Bilateral testing is considered an integral part of a complete examination. Limited examinations for reoccurring indications may be performed as noted. The reflux portion of the exam is performed with the patient in reverse Trendelenburg.  +---------+---------------+---------+-----------+----------+--------------+ RIGHT    CompressibilityPhasicitySpontaneityPropertiesThrombus Aging +---------+---------------+---------+-----------+----------+--------------+ CFV      Full                                         pulsatile flow +---------+---------------+---------+-----------+----------+--------------+ SFJ      Full                                                         +---------+---------------+---------+-----------+----------+--------------+  FV Prox  Full                                                        +---------+---------------+---------+-----------+----------+--------------+ FV Mid   Full                                                        +---------+---------------+---------+-----------+----------+--------------+ FV DistalFull                                                        +---------+---------------+---------+-----------+----------+--------------+ PFV      Full                                                        +---------+---------------+---------+-----------+----------+--------------+ POP      Full                                         pulsatile flow +---------+---------------+---------+-----------+----------+--------------+ PTV      Full                                                        +---------+---------------+---------+-----------+----------+--------------+ PERO     Full                                                        +---------+---------------+---------+-----------+----------+--------------+   +---------+---------------+---------+-----------+----------+--------------+ LEFT     CompressibilityPhasicitySpontaneityPropertiesThrombus Aging +---------+---------------+---------+-----------+----------+--------------+ CFV      Full           Yes      Yes                  pulsatile flow +---------+---------------+---------+-----------+----------+--------------+ SFJ      Full                                                        +---------+---------------+---------+-----------+----------+--------------+ FV Prox  Full                                                        +---------+---------------+---------+-----------+----------+--------------+ FV Mid   Full                                                         +---------+---------------+---------+-----------+----------+--------------+  FV DistalFull                                                        +---------+---------------+---------+-----------+----------+--------------+ PFV      Full                                                        +---------+---------------+---------+-----------+----------+--------------+ POP      Full                                         pulsatile flow +---------+---------------+---------+-----------+----------+--------------+ PTV      Full                                                        +---------+---------------+---------+-----------+----------+--------------+ PERO     Full                                                        +---------+---------------+---------+-----------+----------+--------------+     Summary: BILATERAL: - No evidence of deep vein thrombosis seen in the lower extremities, bilaterally. - RIGHT: pulsatile flow suggestive of fluid overload  LEFT: Pulsatile flow suggestive of fluid overload.  *See table(s) above for measurements and observations. Electronically signed by Ruta Hinds MD on 10/04/2020 at 5:56:48 PM.    Final     Assessment/Plan: 1-PPD #6 from repeat cesarean section 2-Covid positive 3-Pulmonary Edema She has received lasix 40mg  po at about 1530 with modest response. She is symptomatic while flat and noted a low oxygen saturation at home while resting. Her symptoms are not improving. There could be a component of post partum cardiomyopathy in addition to Covid-19. I D/W Dr Roel Cluck who agreed to admit patient to a medicine bed on her service. We will continue to monitor oxygen saturation and transfer when bed is available.  Shon Millet II 10/04/2020, 9:25 PM

## 2020-10-04 NOTE — MAU Provider Note (Signed)
History     CSN: [id number]  Arrival date and time: 10/04/20 1453   Event Date/Time   First Provider Initiated Contact with Patient 10/04/20 1610       Chief Complaint  Patient presents with  . Shortness of Breath   HPI This is a 40yo [id number] who is 6 days postpartum from a Cesarean section with vertical skin incision. Pregnancy complicated by protein S deficiency. She is seen for worsening SOB that is worse with exertion and laying down flat and improved with rest. She has been unable to sleep in her bed because of the worsening SOB - has to sleep in chair. Occasional mild cough, but no chest pain. She was seen yesterday for similar problems. Yesterday, CXR showed mild pulmonary edema, CTA was done, which was negative for PE. BP was mildly elevated.  Yesterday, her symptoms were attributed to COVID and the patient was given a dose of Remdesivir and sent home (COVID positive on 2/14 prior to surgery).  OB History    Gravida  6   Para  5   Term  1   Preterm  1   AB  0   Living  5     SAB  0   IAB      Ectopic      Multiple  0   Live Births  5           Past Medical History:  Diagnosis Date  . Acid reflux   . Anxiety   . Cervical disc disorder 09/14/2017   Very minimal disc space narrowing C4-5 and C5-6 by x-ray.  . IBS (irritable bowel syndrome)   . Immunity status testing 04/16/2013   Tested positive for immunity measles/mumps/rubella and varicella.  . Iron deficiency anemia    Patient reports having to have iron/blood infusions in the past.  . Migraines   . Pancreatitis   . Placenta previa   . Prediabetes 11/08/2018  . Protein S deficiency ([place])   . Right upper quadrant abdominal pain 04/17/2017  . Vitamin D deficiency     Past Surgical History:  Procedure Laterality Date  . CESAREAN SECTION     x 4.  2007, 2008, 2009, 2010.  2007 was emergent C-section.  Marland Kitchen CESAREAN SECTION N/A 09/28/2020   Procedure: REPEAT CESAREAN SECTION EDC: 10-13-20  ALLERGIES: CODEINE;  Surgeon: Everlene Farrier, MD;  Location: [place] [facility];  Service: Obstetrics;  Laterality: N/A;  . TONSILLECTOMY  2000  . Tubal sterilization reversal  01/2019    Family History  Problem Relation Age of Onset  . Hypertension Mother     Social History   Tobacco Use  . Smoking status: Never Smoker  . Smokeless tobacco: Never Used  Vaping Use  . Vaping Use: Never used  Substance Use Topics  . Alcohol use: No  . Drug use: No    Allergies:  Allergies  Allergen Reactions  . Codeine Other (See Comments)    Unknown childhood reaction.    Medications Prior to Admission  Medication Sig Dispense Refill Last Dose  . enoxaparin (LOVENOX) 40 MG/0.4ML injection Inject 0.4 mLs (40 mg total) into the skin daily. 10 mL 1 10/03/2020 at 2030  . ferrous sulfate (SLOW IRON) 160 (50 Fe) MG TBCR SR tablet Take 160 mg by mouth daily.   10/03/2020 at Unknown time  . ibuprofen (ADVIL) 100 MG/5ML suspension Take 30 mLs (600 mg total) by mouth every 6 (six) hours as needed for mild pain or moderate pain.  473 mL 1 10/04/2020 at 1200  . oxyCODONE (ROXICODONE) 5 MG/5ML solution Take 5 mLs (5 mg total) by mouth every 4 (four) hours as needed for up to 7 days for moderate pain. 473 mL 0 10/03/2020 at Unknown time  . Prenatal Vit-Fe Fumarate-FA (PRENATAL MULTIVITAMIN) TABS tablet Take 1 tablet by mouth daily at 12 noon.   Past Week at Unknown time  . amLODipine (NORVASC) 5 MG tablet Take 1 tablet (5 mg total) by mouth daily. 30 tablet 0   . FLUoxetine (PROZAC) 20 MG tablet TAKE 1 TABLET BY MOUTH DAILY (Patient taking differently: Take 20 mg by mouth daily.) 90 tablet 1   . hydrOXYzine (ATARAX/VISTARIL) 25 MG tablet Take 25 mg by mouth at bedtime.     . pantoprazole (PROTONIX) 40 MG tablet TAKE 1 TABLET BY MOUTH DAILY. (Patient taking differently: Take 40 mg by mouth daily.) 90 tablet 1     Review of Systems  All other systems reviewed and are negative.  Physical Exam   Blood pressure 131/68,  pulse 88, temperature 98.3 F (36.8 C), resp. rate 20, SpO2 96 %, unknown if currently breastfeeding.  Physical Exam Vitals and nursing note reviewed.  Constitutional:      Appearance: She is well-developed.  HENT:     Head: Normocephalic and atraumatic.  Cardiovascular:     Rate and Rhythm: Normal rate and regular rhythm.  Pulmonary:     Effort: Pulmonary effort is normal. No tachypnea or respiratory distress.     Breath sounds: No stridor. Examination of the right-lower field reveals rales. Examination of the left-lower field reveals rales. Rales present.  Abdominal:     Palpations: Abdomen is soft.  Musculoskeletal:     Cervical back: Normal range of motion and neck supple.     Right lower leg: Edema present.     Left lower leg: Edema present.     Comments: 3+ pitting edema to knees  Skin:    General: Skin is warm and dry.     Capillary Refill: Capillary refill takes less than 2 seconds.  Neurological:     General: No focal deficit present.     Mental Status: She is alert.  Psychiatric:        Mood and Affect: Mood normal.        Behavior: Behavior normal.    Results for orders placed or performed during the hospital encounter of 10/04/20 (from the past 24 hour(s))  CBC     Status: Abnormal   Collection Time: 10/04/20  4:20 PM  Result Value Ref Range   WBC 14.3 (H) 4.0 - 10.5 K/uL   RBC 3.67 (L) 3.87 - 5.11 MIL/uL   Hemoglobin 8.9 (L) 12.0 - 15.0 g/dL   HCT 28.8 (L) 36.0 - 46.0 %   MCV 78.5 (L) 80.0 - 100.0 fL   MCH 24.3 (L) 26.0 - 34.0 pg   MCHC 30.9 30.0 - 36.0 g/dL   RDW 21.5 (H) 11.5 - 15.5 %   Platelets 375 150 - 400 K/uL   nRBC 0.3 (H) 0.0 - 0.2 %  Comprehensive metabolic panel     Status: Abnormal   Collection Time: 10/04/20  4:20 PM  Result Value Ref Range   Sodium 139 135 - 145 mmol/L   Potassium 2.8 (L) 3.5 - 5.1 mmol/L   Chloride 103 98 - 111 mmol/L   CO2 25 22 - 32 mmol/L   Glucose, Bld 87 70 - 99 mg/dL   BUN 7 6 -  20 mg/dL   Creatinine, Ser 0.75  0.44 - 1.00 mg/dL   Calcium 8.0 (L) 8.9 - 10.3 mg/dL   Total Protein 5.3 (L) 6.5 - 8.1 g/dL   Albumin 2.1 (L) 3.5 - 5.0 g/dL   AST 11 (L) 15 - 41 U/L   ALT 18 0 - 44 U/L   Alkaline Phosphatase 127 (H) 38 - 126 U/L   Total Bilirubin 0.8 0.3 - 1.2 mg/dL   GFR, Estimated >60 >60 mL/min   Anion gap 11 5 - 15  Brain natriuretic peptide     Status: Abnormal   Collection Time: 10/04/20  4:20 PM  Result Value Ref Range   B Natriuretic Peptide 268.0 (H) 0.0 - 100.0 pg/mL  D-dimer, quantitative     Status: Abnormal   Collection Time: 10/04/20  4:20 PM  Result Value Ref Range   D-Dimer, Quant 5.12 (H) 0.00 - 0.50 ug/mL-FEU  Troponin I (High Sensitivity)     Status: Abnormal   Collection Time: 10/04/20  4:20 PM  Result Value Ref Range   Troponin I (High Sensitivity) 25 (H) <18 ng/L  C-reactive protein     Status: Abnormal   Collection Time: 10/04/20  4:20 PM  Result Value Ref Range   CRP 18.9 (H) <1.0 mg/dL  Troponin I (High Sensitivity)     Status: Abnormal   Collection Time: 10/04/20  6:24 PM  Result Value Ref Range   Troponin I (High Sensitivity) 20 (H) <18 ng/L    DG CHEST PORT 1 VIEW  Result Date: 10/04/2020 CLINICAL DATA:  Shortness of breath COVID September 27, 2020 EXAM: PORTABLE CHEST 1 VIEW COMPARISON:  Chest radiograph October 03, 2020 and CT a chest October 03, 2020 FINDINGS: The heart size and mediastinal contours are unchanged. Right basilar platelike atelectasis. No focal consolidation. No pleural effusion. No visible pneumothorax. The visualized skeletal structures are unchanged. IMPRESSION: Right basilar platelike atelectasis, no new focal consolidation. The. Electronically Signed   By: Dahlia Bailiff MD   On: 10/04/2020 16:14     MAU Course  Procedures  MDM Symptoms appears to consistent with pulmonary edema. Will get CBC, CMP, DDimer, Troponin, CRP. Will give Lasix 40mg  PO now.  Still feels SOB. Has not diuresed.    Assessment and Plan     ICD-10-CM   1.  Acute pulmonary edema (HCC)  J81.0   2. SOB (shortness of breath)  R06.02 DG CHEST PORT 1 VIEW    DG CHEST PORT 1 VIEW  3. S/P cesarean section  Z98.891   4. Hypokalemia  E87.6     I discussed patient with Dr Gaetano Net, including yesterday's course, today's lab results. Recommended admission, diuresis, echo in AM. Dr Gaetano Net agreeable.   Truett Mainland 10/04/2020, 7:33 PM

## 2020-10-04 NOTE — Telephone Encounter (Signed)
I am happy to take her

## 2020-10-04 NOTE — Progress Notes (Signed)
I connected with  Jennifer Summers on 10/04/20 by a video enabled telemedicine application and verified that I am speaking with the correct person using two identifiers.   I discussed the limitations of evaluation and management by telemedicine. The patient expressed understanding and agreed to proceed.

## 2020-10-04 NOTE — MAU Note (Signed)
Presents with c/o SOB, states had virtual visit with PCP and instructed to be evaluated in MAU.  Reports seen yesterday in MAU with same complaint and several test performed.  Also states O2 stats dropped into 80's during the night. S/P Cesarean Section 09/28/2020/

## 2020-10-04 NOTE — H&P (Signed)
Jennifer Summers IRS:[id number] DOB: [birth year] DOA: 10/04/2020    PCP: Midge Minium, MD   Outpatient Specialists:     Patient arrived to ER on 10/04/20 at 1453 Referred by Attending Toy Baker, MD   Patient coming from: home Lives  With family    Chief Complaint: Chief Complaint  Patient presents with   Shortness of Breath    HPI: Jennifer Summers is a 40 y.o. female with medical history significant of recent C.section on 15 February, protein S deficiency, prediabetes    Presented with patient was exposed about 9 days ago to a child who was positive for Covid. She tested positive for Covid about 5 days ago on 14 February. Initial chest x-ray only showed atelectasis She had CTA done negative for PE She was given 1 dose of remdesivir on a 20 February the plan was to follow-up with 2 more doses as outpatient Dopplers were negative DVT Given leg edema which was out of proportion to what expected after C-section she was given a dose of p.o. Lasix but continued to have orthopnea or shortness of breath. Have not had any chest pain. Of note yesterday her BNP was elevated and also had mild elevated troponin Which was repeated today troponin 20 initially She does appear to have mild anemia which is and not unexpected in the setting of recent C-section She was hypokalemic yesterday and that was replaced She was seen at [facility] yesterday on the 20th [facility] hospitalist was consulted and felt that patient was able to follow-up as an outpatient remdesivir Today she had a televisit with her PCP and continued to complain of shortness of breath as well as reported that her oxygen O2 saturation was 82 while she was sleeping. She was told to present back to [facility] where she continued to feel short of breath at which point right hospitalist was called to assist with further care for Covid She continues to have some cough no fever no chest pain Infectious risk factors:  Reports  shortness of breath,   KNOWN COVID POSITIVE   Has been vaccinated against COVID     Lab Results  Component Value Date   SARSCOV2NAA POSITIVE (A) 09/27/2020    Regarding pertinent Chronic problems:   anemia - baseline hg Hemoglobin & Hematocrit  Recent Labs    09/29/20 0535 10/03/20 1215 10/04/20 1620  HGB 9.1* 9.1* 8.9*    Protein C deficiency has been on Lovenox through pregnancy      Hospitalist was called for admission for Covid infection with hypoxia at home  The following Work up has been ordered so far:  Orders Placed This Encounter  Procedures   Culture, sputum-assessment   DG CHEST PORT 1 VIEW   CBC   Comprehensive metabolic panel   Brain natriuretic peptide   D-dimer, quantitative   C-reactive protein   Comprehensive metabolic panel   CBC with Differential   Ferritin   Fibrinogen   Lactate dehydrogenase   Procalcitonin   C-reactive protein   CBC with Differential/Platelet   Comprehensive metabolic panel   C-reactive protein   Ferritin   Magnesium   Phosphorus   CBC with Differential/Platelet   Diet Heart Room service appropriate? Yes; Fluid consistency: Thin   Notify Physician   Initiate Oral Care Protocol   Initiate Carrier Fluid Protocol   SCDs   Vital signs   Strict intake and output   Measure blood pressure   Cardiac monitoring   Novel Coronavirus PPE supplies (droplet  and contact precautions) yellow stethoscopes, surgical mask, gowns, surgical caps, face shield, goggles, CAPR - on the floor/unit, cleaning Sani-Cloth (orange and purple top)   Place COVID-19 isolation sign and PPE checklist outside the DOOR   Do not give nonsteroidal anti-inflammatory drugs (NSAIDs)   Patient to wear surgical mask during transportation   Place working phone next to the patient   Initiate Oral Care Protocol   Initiate Carrier Fluid Protocol   Cardiac Monitoring - Continuous Indefinite   Full code   remdesivir per  pharmacy consult   Airborne and Contact precautions   Pulse oximetry check with vital signs   Incentive spirometry   Flutter valve   EKG 12-Lead   ECHOCARDIOGRAM COMPLETE   Insert saline lock   Admit to Inpatient (patient's expected length of stay will be greater than 2 midnights or inpatient only procedure)   Place in observation (patient's expected length of stay will be less than 2 midnights)    Following Medications were ordered in ER: Medications  enoxaparin (LOVENOX) injection 40 mg (has no administration in time range)  lactated ringers infusion (has no administration in time range)  furosemide (LASIX) injection 20 mg (has no administration in time range)  labetalol (NORMODYNE) injection 20 mg (has no administration in time range)    And  labetalol (NORMODYNE) injection 40 mg (has no administration in time range)    And  labetalol (NORMODYNE) injection 80 mg (has no administration in time range)    And  hydrALAZINE (APRESOLINE) injection 10 mg (has no administration in time range)  remdesivir 200 mg in sodium chloride 0.9% 250 mL IVPB (has no administration in time range)    Followed by  remdesivir 100 mg in sodium chloride 0.9 % 100 mL IVPB (has no administration in time range)  albuterol (VENTOLIN HFA) 108 (90 Base) MCG/ACT inhaler 2 puff (has no administration in time range)  guaiFENesin-dextromethorphan (ROBITUSSIN DM) 100-10 MG/5ML syrup 10 mL (has no administration in time range)  furosemide (LASIX) tablet 40 mg (40 mg Oral Given 10/04/20 1646)  potassium chloride (KLOR-CON) packet 40 mEq (40 mEq Oral Given 10/04/20 1902)     Significant initial  Findings: Abnormal Labs Reviewed  CBC - Abnormal; Notable for the following components:      Result Value   WBC 14.3 (*)    RBC 3.67 (*)    Hemoglobin 8.9 (*)    HCT 28.8 (*)    MCV 78.5 (*)    MCH 24.3 (*)    RDW 21.5 (*)    nRBC 0.3 (*)    All other components within normal limits  COMPREHENSIVE METABOLIC  PANEL - Abnormal; Notable for the following components:   Potassium 2.8 (*)    Calcium 8.0 (*)    Total Protein 5.3 (*)    Albumin 2.1 (*)    AST 11 (*)    Alkaline Phosphatase 127 (*)    All other components within normal limits  BRAIN NATRIURETIC PEPTIDE - Abnormal; Notable for the following components:   B Natriuretic Peptide 268.0 (*)    All other components within normal limits  D-DIMER, QUANTITATIVE - Abnormal; Notable for the following components:   D-Dimer, Quant 5.12 (*)    All other components within normal limits  C-REACTIVE PROTEIN - Abnormal; Notable for the following components:   CRP 18.9 (*)    All other components within normal limits  FIBRINOGEN - Abnormal; Notable for the following components:   Fibrinogen 520 (*)    All  other components within normal limits  LACTATE DEHYDROGENASE - Abnormal; Notable for the following components:   LDH 259 (*)    All other components within normal limits  C-REACTIVE PROTEIN - Abnormal; Notable for the following components:   CRP 19.6 (*)    All other components within normal limits  CBC WITH DIFFERENTIAL/PLATELET - Abnormal; Notable for the following components:   WBC 13.4 (*)    Hemoglobin 9.4 (*)    HCT 30.9 (*)    MCV 78.8 (*)    MCH 24.0 (*)    RDW 21.5 (*)    nRBC 0.4 (*)    Neutro Abs 11.8 (*)    All other components within normal limits  TROPONIN I (HIGH SENSITIVITY) - Abnormal; Notable for the following components:   Troponin I (High Sensitivity) 25 (*)    All other components within normal limits  TROPONIN I (HIGH SENSITIVITY) - Abnormal; Notable for the following components:   Troponin I (High Sensitivity) 20 (*)    All other components within normal limits    Otherwise labs showing:    Recent Labs  Lab 10/03/20 1215 10/04/20 1620  NA 135 139  K 3.1* 2.8*  CO2 25 25  GLUCOSE 84 87  BUN 7 7  CREATININE 0.71 0.75  CALCIUM 7.9* 8.0*    Cr    Stable,  Lab Results  Component Value Date   CREATININE  0.75 10/04/2020   CREATININE 0.71 10/03/2020   CREATININE 0.81 09/27/2020    Recent Labs  Lab 10/03/20 1215 10/04/20 1620  AST 12* 11*  ALT 23 18  ALKPHOS 102 127*  BILITOT 0.8 0.8  PROT 5.3* 5.3*  ALBUMIN 2.2* 2.1*   Lab Results  Component Value Date   CALCIUM 8.0 (L) 10/04/2020     WBC      Component Value Date/Time   WBC 14.3 (H) 10/04/2020 1620   LYMPHSABS 1.0 10/03/2020 1215   MONOABS 1.5 (H) 10/03/2020 1215   EOSABS 0.1 10/03/2020 1215   BASOSABS 0.0 10/03/2020 1215     Plt: Lab Results  Component Value Date   PLT 375 10/04/2020    Lactic Acid, Venous    Component Value Date/Time   LATICACIDVEN 1.5 10/03/2020 1529      Procalcitonin 0.62   COVID-19 Labs  Recent Labs    10/03/20 1215 10/03/20 1528 10/04/20 1620  DDIMER 4.47*  --  5.12*  FERRITIN  --  91  --   CRP  --  19.4* 18.9*    Lab Results  Component Value Date   SARSCOV2NAA POSITIVE (A) 09/27/2020    HG/HCT  stable,      Component Value Date/Time   HGB 8.9 (L) 10/04/2020 1620   HCT 28.8 (L) 10/04/2020 1620   MCV 78.5 (L) 10/04/2020 1620      Troponin 25 20 -50   ECG: Ordered     BNP (last 3 results) Recent Labs    10/03/20 1215 10/04/20 1620  BNP 359.8* 268.0*       UA  not ordered       Ordered    CXR -Right basilar platelike atelectasis   CTA chest -  no PE,  no evidence of infiltrate    ED Triage Vitals  Enc Vitals Group     BP 10/04/20 1517 131/68     Pulse Rate 10/04/20 1517 88     Resp 10/04/20 1517 20     Temp 10/04/20 1517 98.3 F (36.8 C)  Temp src --      SpO2 10/04/20 1517 97 %     Weight --      Height --      Head Circumference --      Peak Flow --      Pain Score 10/04/20 1519 4     Pain Loc --      Pain Edu? --      Excl. in [place]? --   TMAX(24)@       Latest  Blood pressure 131/68, pulse 88, temperature 98.3 F (36.8 C), resp. rate 20, SpO2 96 %, unknown if currently breastfeeding.     Review of Systems:    Pertinent  positives include:    Fatigue  Bilateral lower extremity swelling  shortness of breath at rest. dyspnea on exertion Orthopnea, Constitutional:  No weight loss, night sweats, Fevers, chills,, weight loss  HEENT:  No headaches, Difficulty swallowing,Tooth/dental problems,Sore throat,  No sneezing, itching, ear ache, nasal congestion, post nasal drip,  Cardio-vascular:  No chest pain, PND, anasarca, dizziness, palpitations.no GI:  No heartburn, indigestion, abdominal pain, nausea, vomiting, diarrhea, change in bowel habits, loss of appetite, melena, blood in stool, hematemesis Resp:    No excess mucus, no productive cough, No non-productive cough, No coughing up of blood .No change in color of mucus.No wheezing. Skin:  no rash or lesions. No jaundice GU:  no dysuria, change in color of urine, no urgency or frequency. No straining to urinate.  No flank pain.  Musculoskeletal:  No joint pain or no joint swelling. No decreased range of motion. No back pain.  Psych:  No change in mood or affect. No depression or anxiety. No memory loss.  Neuro: no localizing neurological complaints, no tingling, no weakness, no double vision, no gait abnormality, no slurred speech, no confusion  All systems reviewed and apart from [place] all are negative  Past Medical History:   Past Medical History:  Diagnosis Date   Acid reflux    Anxiety    Cervical disc disorder 09/14/2017   Very minimal disc space narrowing C4-5 and C5-6 by x-ray.   IBS (irritable bowel syndrome)    Immunity status testing 04/16/2013   Tested positive for immunity measles/mumps/rubella and varicella.   Iron deficiency anemia    Patient reports having to have iron/blood infusions in the past.   Migraines    Pancreatitis    Placenta previa    Prediabetes 11/08/2018   Protein S deficiency ([place])    Right upper quadrant abdominal pain 04/17/2017   Vitamin D deficiency       Past Surgical History:  Procedure  Laterality Date   CESAREAN SECTION     x 4.  2007, 2008, 2009, 2010.  2007 was emergent C-section.   CESAREAN SECTION N/A 09/28/2020   Procedure: REPEAT CESAREAN SECTION EDC: 10-13-20 ALLERGIES: CODEINE;  Surgeon: Everlene Farrier, MD;  Location: [facility];  Service: Obstetrics;  Laterality: N/A;   TONSILLECTOMY  2000   Tubal sterilization reversal  01/2019    Social History:  Ambulatory  independently      reports that she has never smoked. She has never used smokeless tobacco. She reports that she does not drink alcohol and does not use drugs.   Family History:   Family History  Problem Relation Age of Onset   Hypertension Mother     Allergies: Allergies  Allergen Reactions   Codeine Other (See Comments)    Unknown childhood reaction.     Prior  to Admission medications   Medication Sig Start Date End Date Taking? Authorizing Provider  enoxaparin (LOVENOX) 40 MG/0.4ML injection Inject 0.4 mLs (40 mg total) into the skin daily. 09/30/20  Yes Louretta Shorten, MD  ferrous sulfate (SLOW IRON) 160 (50 Fe) MG TBCR SR tablet Take 160 mg by mouth daily.   Yes [provider]  ibuprofen (ADVIL) 100 MG/5ML suspension Take 30 mLs (600 mg total) by mouth every 6 (six) hours as needed for mild pain or moderate pain. 09/30/20  Yes Louretta Shorten, MD  oxyCODONE (ROXICODONE) 5 MG/5ML solution Take 5 mLs (5 mg total) by mouth every 4 (four) hours as needed for up to 7 days for moderate pain. 09/30/20 10/07/20 Yes Louretta Shorten, MD  Prenatal Vit-Fe Fumarate-FA (PRENATAL MULTIVITAMIN) TABS tablet Take 1 tablet by mouth daily at 12 noon.   Yes [provider]  amLODipine (NORVASC) 5 MG tablet Take 1 tablet (5 mg total) by mouth daily. 10/03/20   Wende Mott, CNM  FLUoxetine (PROZAC) 20 MG tablet TAKE 1 TABLET BY MOUTH DAILY Patient taking differently: Take 20 mg by mouth daily. 07/12/20   Brunetta Jeans, PA-C  hydrOXYzine (ATARAX/VISTARIL) 25 MG tablet Take 25 mg by mouth at  bedtime.    [provider]  pantoprazole (PROTONIX) 40 MG tablet TAKE 1 TABLET BY MOUTH DAILY. Patient taking differently: Take 40 mg by mouth daily. 04/13/20   Brunetta Jeans, PA-C   Physical Exam: Vitals with BMI 10/04/2020 10/03/2020 10/03/2020  Height - - -  Weight - - -  BMI - - -  Systolic 188 416 606  Diastolic 68 82 68  Pulse 88 95 87   1. General:  in No Acute distress   well  -appearing 2. Psychological: Alert and Oriented 3. Head/ENT:   Moist  Mucous Membranes                          Head Non traumatic, neck supple                          Normal   Dentition 4. SKIN: normal  Skin turgor,  Skin clean Dry and intact no rash 5. Heart: Regular rate and rhythm no  Murmur, no Rub or gallop 6. Lungs:  no wheezes or crackles   7. Abdomen: Soft, non-tender, Non distended   bowel sounds present 8. Lower extremities: no clubbing, cyanosis,2+edema 9. Neurologically Grossly intact, moving all 4 extremities equally  10. MSK: Normal range of motion   All other LABS:     Recent Labs  Lab 09/29/20 0535 10/03/20 1215 10/04/20 1620  WBC 11.2* 20.0* 14.3*  NEUTROABS  --  17.1*  --   HGB 9.1* 9.1* 8.9*  HCT 28.2* 29.5* 28.8*  MCV 77.5* 79.1* 78.5*  PLT 280 334 375     Recent Labs  Lab 10/03/20 1215 10/04/20 1620  NA 135 139  K 3.1* 2.8*  CL 100 103  CO2 25 25  GLUCOSE 84 87  BUN 7 7  CREATININE 0.71 0.75  CALCIUM 7.9* 8.0*     Recent Labs  Lab 10/03/20 1215 10/04/20 1620  AST 12* 11*  ALT 23 18  ALKPHOS 102 127*  BILITOT 0.8 0.8  PROT 5.3* 5.3*  ALBUMIN 2.2* 2.1*   Cultures:    Component Value Date/Time   SDES BLOOD SITE NOT SPECIFIED 10/03/2020 1412   SPECREQUEST  10/03/2020 1412  BOTTLES DRAWN AEROBIC AND ANAEROBIC Blood Culture results may not be optimal due to an inadequate volume of blood received in culture bottles   CULT  10/03/2020 1412    NO GROWTH < 24 HOURS Performed at [facility] [address] [place], [place]  [address]    REPTSTATUS PENDING 10/03/2020 1412     Radiological Exams on Admission: CT Angio Chest PE W and/or Wo Contrast  Result Date: 10/03/2020 CLINICAL DATA:  Shortness of breath and bilateral lower extremity swelling for 2 days. Elevated D-dimer. EXAM: CT ANGIOGRAPHY CHEST WITH CONTRAST TECHNIQUE: Multidetector CT imaging of the chest was performed using the standard protocol during bolus administration of intravenous contrast. Multiplanar CT image reconstructions and MIPs were obtained to evaluate the vascular anatomy. CONTRAST:  80 mL OMNIPAQUE IOHEXOL 350 MG/ML SOLN COMPARISON:  Single-view of the chest today. FINDINGS: Cardiovascular: No pulmonary embolus is identified. No aneurysm. Heart size is upper normal. No pericardial effusion. Mediastinum/Nodes: No enlarged mediastinal, hilar, or axillary lymph nodes. Thyroid gland, trachea, and esophagus demonstrate no significant findings. Lungs/Pleura: No pleural effusion. There is mild dependent atelectasis. Lungs otherwise clear. Upper Abdomen: Negative. Musculoskeletal: Negative. Review of the MIP images confirms the above findings. IMPRESSION: Negative for pulmonary embolus.  Negative chest CT. Electronically Signed   By: Inge Rise M.D.   On: 10/03/2020 13:15   DG CHEST PORT 1 VIEW  Result Date: 10/04/2020 CLINICAL DATA:  Shortness of breath COVID September 27, 2020 EXAM: PORTABLE CHEST 1 VIEW COMPARISON:  Chest radiograph October 03, 2020 and CT a chest October 03, 2020 FINDINGS: The heart size and mediastinal contours are unchanged. Right basilar platelike atelectasis. No focal consolidation. No pleural effusion. No visible pneumothorax. The visualized skeletal structures are unchanged. IMPRESSION: Right basilar platelike atelectasis, no new focal consolidation. The. Electronically Signed   By: Dahlia Bailiff MD   On: 10/04/2020 16:14   DG Chest Portable 1 View  Result Date: 10/03/2020 CLINICAL DATA:  Shortness of breath and leg  swelling. EXAM: PORTABLE CHEST 1 VIEW COMPARISON:  None. FINDINGS: The cardiomediastinal silhouette is unremarkable. Mild pulmonary vascular congestion is noted. Opacities/atelectasis in the MEDIAL RIGHT LOWER lung noted. No pleural effusion or pneumothorax noted. No acute bony abnormalities are present. IMPRESSION: RIGHT LOWER lung opacities versus atelectasis. This may represent infection/pneumonia. Mild pulmonary vascular congestion. Electronically Signed   By: Margarette Canada M.D.   On: 10/03/2020 11:49   VAS [place] LOWER EXTREMITY VENOUS (DVT)  Result Date: 10/04/2020  Lower Venous DVT Study Indications: Swelling, SOB, and Covid-19, history of Protein S deficiency.  Risk Factors: Surgery C-Section 09/28/20. Limitations: Edema, pain from staples. Comparison Study: No prior study Performing Technologist: Sharion Dove RVS  Examination Guidelines: A complete evaluation includes B-mode imaging, spectral Doppler, color Doppler, and power Doppler as needed of all accessible portions of each vessel. Bilateral testing is considered an integral part of a complete examination. Limited examinations for reoccurring indications may be performed as noted. The reflux portion of the exam is performed with the patient in reverse Trendelenburg.  +---------+---------------+---------+-----------+----------+--------------+  RIGHT     Compressibility Phasicity Spontaneity Properties Thrombus Aging  +---------+---------------+---------+-----------+----------+--------------+  CFV       Full                                             pulsatile flow  +---------+---------------+---------+-----------+----------+--------------+  SFJ  Full                                                             +---------+---------------+---------+-----------+----------+--------------+  FV Prox   Full                                                             +---------+---------------+---------+-----------+----------+--------------+  FV Mid    Full                                                              +---------+---------------+---------+-----------+----------+--------------+  FV Distal Full                                                             +---------+---------------+---------+-----------+----------+--------------+  PFV       Full                                                             +---------+---------------+---------+-----------+----------+--------------+  POP       Full                                             pulsatile flow  +---------+---------------+---------+-----------+----------+--------------+  PTV       Full                                                             +---------+---------------+---------+-----------+----------+--------------+  PERO      Full                                                             +---------+---------------+---------+-----------+----------+--------------+   +---------+---------------+---------+-----------+----------+--------------+  LEFT      Compressibility Phasicity Spontaneity Properties Thrombus Aging  +---------+---------------+---------+-----------+----------+--------------+  CFV       Full            Yes       Yes                    pulsatile flow  +---------+---------------+---------+-----------+----------+--------------+  SFJ  Full                                                             +---------+---------------+---------+-----------+----------+--------------+  FV Prox   Full                                                             +---------+---------------+---------+-----------+----------+--------------+  FV Mid    Full                                                             +---------+---------------+---------+-----------+----------+--------------+  FV Distal Full                                                             +---------+---------------+---------+-----------+----------+--------------+  PFV       Full                                                              +---------+---------------+---------+-----------+----------+--------------+  POP       Full                                             pulsatile flow  +---------+---------------+---------+-----------+----------+--------------+  PTV       Full                                                             +---------+---------------+---------+-----------+----------+--------------+  PERO      Full                                                             +---------+---------------+---------+-----------+----------+--------------+     Summary: BILATERAL: - No evidence of deep vein thrombosis seen in the lower extremities, bilaterally. - RIGHT: pulsatile flow suggestive of fluid overload  LEFT: Pulsatile flow suggestive of fluid overload.  *See table(s) above for measurements and observations. Electronically signed by Ruta Hinds MD on 10/04/2020 at 5:56:48 PM.    Final     Chart has been reviewed    Assessment/Plan  40 y.o. female with medical history significant of recent C.section on 15 February, protein S deficiency, prediabetes Admitted for COVID infection and   Present on Admission:  Dyspnea likely multifactorial combination of Covid infection and possible fluid overload.  Will obtain echogram At this point on room air  COVID-19 virus infection -currently back to being on room air.  Yesterday we will start on remdesivir will continue for next 3 days. Continue to monitor CRP.  At this point no evidence of infiltrate or hypoxia but will have a low threshold for starting steroids if needed   Elevated troponin -   demand ischemia vs 2/2 to [place] will order ECG no hx of CAD, no CP Order echo in Am if abnormal will need cardiology consult  Hypokalemia - - will replace and repeat in AM,  check magnesium level and replace as needed   Protein S deficiency ([place]) continue Lovenox   Fluid overload -patient states Lasix initially have helped her.  We will continue gentle  diuresis  Elevated BP - hold Novasc as that can contribute to leg edema Other plan as per orders.  DVT prophylaxis:    enoxaparin (LOVENOX) injection 40 mg Start: 10/04/20 2115 SCDs Start: 10/04/20 2103    Code Status:    Code Status: Full Code FULL CODE as per patient   I had personally discussed CODE STATUS with patient     Family Communication:   Family not at  Bedside    Disposition Plan:     To home once workup is complete and patient is stable   Following barriers for discharge:                                 Anemia stable Cardiac work up is completed                       Consults called: OB GYN is aware       Obs   Level of care   tele    indefinitely please discontinue once patient no longer qualifies COVID-19 Labs    Lab Results  Component Value Date   [place] (A) 09/27/2020     Precautions: admitted as  covid positive Airborne and Contact precautions   PPE: Used by the provider:   P100  eye Goggles,  Gloves  gown    Desman Polak 10/05/2020, 12:43 AM    [facility]     after 2 AM please page floor coverage PA If 7AM-7PM, please contact the day team taking care of the patient using [email or web address]   Patient was evaluated in the context of the global COVID-19 pandemic, which necessitated consideration that the patient might be at risk for infection with the SARS-CoV-2 virus that causes COVID-19. Institutional protocols and algorithms that pertain to the evaluation of patients at risk for COVID-19 are in a state of rapid change based on information released by regulatory bodies including the CDC and federal and state organizations. These policies and algorithms were followed during the patient's care.

## 2020-10-04 NOTE — Telephone Encounter (Signed)
Pt would like to take if you would be her PCP, someone has changed the pcp to you in Epic (maybe the hospital)

## 2020-10-04 NOTE — Progress Notes (Signed)
Virtual Visit via Video   I connected with patient on 10/04/20 at 11:30 AM EST by a video enabled telemedicine application and verified that I am speaking with the correct person using two identifiers.  Location patient: Home Location provider: [facility] Persons participating in the virtual visit: Patient, Provider, Ada (Sabrina M)  I discussed the limitations of evaluation and management by telemedicine and the availability of in person appointments. The patient expressed understanding and agreed to proceed.  Subjective:   HPI:   COVID- pt went to ER yesterday w/ SOB.  She met SIRS criteria (tachypnea and increased WBC) but vitals spontaneously improved w/o intervention.  It was noted that Ddimer was high but thankfully CTA was negative for PE.  She was given Remdesivir in ER and is to have 2 more outpt doses.  BNP was elevated at 359.8 and she had 2-3+ pitting edema.  + HA.  SOB is worse at night- last night O2 sats dropped to 82%.  Did come up to 91%.  Pt reports 'extremely' swollen.  Pt has SOB at rest and w/ talking.  Pt is unable to sleep lying flat- 'it feels like there's fluid in my chest'.  Pt has not heard anything about 2nd and 3rd doses of Remdesivir.  OB is aware of her HA, swelling, and SOB  ROS:   See pertinent positives and negatives per HPI.  Patient Active Problem List   Diagnosis Date Noted  . COVID-19 virus infection 10/03/2020  . Bilateral lower extremity edema 10/03/2020  . Hypokalemia 10/03/2020  . Elevated brain natriuretic peptide (BNP) level 10/03/2020  . Acute respiratory distress 10/03/2020  . SIRS (systemic inflammatory response syndrome) ([place]) 10/03/2020  . Pregnancy 09/28/2020  . H. pylori infection 12/25/2019  . Abnormal TSH 12/25/2019  . Vitamin D deficiency   . Anxiety 08/28/2019  . Chronic GERD 08/25/2019  . Prediabetes 11/08/2018  . Elevated liver function tests 04/17/2017  . Iron deficiency anemia 06/27/2016  . Protein S  deficiency ([place]) 11/01/2005    Social History   Tobacco Use  . Smoking status: Never Smoker  . Smokeless tobacco: Never Used  Substance Use Topics  . Alcohol use: No    Current Outpatient Medications:  .  amLODipine (NORVASC) 5 MG tablet, Take 1 tablet (5 mg total) by mouth daily., Disp: 30 tablet, Rfl: 0 .  enoxaparin (LOVENOX) 40 MG/0.4ML injection, Inject 0.4 mLs (40 mg total) into the skin daily., Disp: 10 mL, Rfl: 1 .  ferrous sulfate (SLOW IRON) 160 (50 Fe) MG TBCR SR tablet, Take 160 mg by mouth daily., Disp: , Rfl:  .  FLUoxetine (PROZAC) 20 MG tablet, TAKE 1 TABLET BY MOUTH DAILY (Patient taking differently: Take 20 mg by mouth daily.), Disp: 90 tablet, Rfl: 1 .  hydrOXYzine (ATARAX/VISTARIL) 25 MG tablet, Take 25 mg by mouth at bedtime., Disp: , Rfl:  .  ibuprofen (ADVIL) 100 MG/5ML suspension, Take 30 mLs (600 mg total) by mouth every 6 (six) hours as needed for mild pain or moderate pain., Disp: 473 mL, Rfl: 1 .  oxyCODONE (ROXICODONE) 5 MG/5ML solution, Take 5 mLs (5 mg total) by mouth every 4 (four) hours as needed for up to 7 days for moderate pain., Disp: 473 mL, Rfl: 0 .  pantoprazole (PROTONIX) 40 MG tablet, TAKE 1 TABLET BY MOUTH DAILY. (Patient taking differently: Take 40 mg by mouth daily.), Disp: 90 tablet, Rfl: 1 .  Prenatal Vit-Fe Fumarate-FA (PRENATAL MULTIVITAMIN) TABS tablet, Take 1 tablet by  mouth daily at 12 noon., Disp: , Rfl:   Allergies  Allergen Reactions  . Codeine Other (See Comments)    Unknown childhood reaction.    Objective:   LMP 09/05/2019 (Exact Date)  AAOx3, obviously uncomfortable NCAT, EOMI No obvious CN deficits Pale Speaking in complete sentences causes pt SOB.  As does changing positions in bed. Thought process is linear.  Mood is appropriate.   Assessment and Plan:   SOB- worsening.  Pt is not able to speak in complete sentences or change positions in bed w/o becoming notably short of breath.  She is pale, swollen.  Given her C  section 6 days ago, her HA, swelling, and mildly elevated BP yesterday in the ER are concerning for eclampsia.  She is also COVID + which further complicates things.  She had 1 dose of Remdesivir yesterday in ER.  Last night, her O2 sats dropped to 82%.  Given her deteriorating clinical picture I offered to call EMS for patient but she declined multiple times.  States that husband will drive her to ER.  I told her that she absolutely must go and ASAP.  She is agreeable to this.  Will follow.   Annye Asa, MD 10/04/2020

## 2020-10-05 ENCOUNTER — Observation Stay (HOSPITAL_COMMUNITY): Payer: No Typology Code available for payment source

## 2020-10-05 DIAGNOSIS — R6510 Systemic inflammatory response syndrome (SIRS) of non-infectious origin without acute organ dysfunction: Secondary | ICD-10-CM | POA: Diagnosis present

## 2020-10-05 DIAGNOSIS — D509 Iron deficiency anemia, unspecified: Secondary | ICD-10-CM | POA: Diagnosis present

## 2020-10-05 DIAGNOSIS — R778 Other specified abnormalities of plasma proteins: Secondary | ICD-10-CM | POA: Diagnosis present

## 2020-10-05 DIAGNOSIS — E8770 Fluid overload, unspecified: Secondary | ICD-10-CM | POA: Diagnosis present

## 2020-10-05 DIAGNOSIS — I110 Hypertensive heart disease with heart failure: Secondary | ICD-10-CM | POA: Diagnosis present

## 2020-10-05 DIAGNOSIS — I509 Heart failure, unspecified: Secondary | ICD-10-CM

## 2020-10-05 DIAGNOSIS — D6859 Other primary thrombophilia: Secondary | ICD-10-CM | POA: Diagnosis present

## 2020-10-05 DIAGNOSIS — R0603 Acute respiratory distress: Secondary | ICD-10-CM | POA: Diagnosis present

## 2020-10-05 DIAGNOSIS — R7303 Prediabetes: Secondary | ICD-10-CM | POA: Diagnosis present

## 2020-10-05 DIAGNOSIS — O9853 Other viral diseases complicating the puerperium: Secondary | ICD-10-CM | POA: Diagnosis present

## 2020-10-05 DIAGNOSIS — J810 Acute pulmonary edema: Secondary | ICD-10-CM | POA: Diagnosis not present

## 2020-10-05 DIAGNOSIS — E8809 Other disorders of plasma-protein metabolism, not elsewhere classified: Secondary | ICD-10-CM | POA: Diagnosis present

## 2020-10-05 DIAGNOSIS — Z5329 Procedure and treatment not carried out because of patient's decision for other reasons: Secondary | ICD-10-CM | POA: Diagnosis present

## 2020-10-05 DIAGNOSIS — R0602 Shortness of breath: Secondary | ICD-10-CM | POA: Diagnosis not present

## 2020-10-05 DIAGNOSIS — Z8249 Family history of ischemic heart disease and other diseases of the circulatory system: Secondary | ICD-10-CM | POA: Diagnosis not present

## 2020-10-05 DIAGNOSIS — E876 Hypokalemia: Secondary | ICD-10-CM | POA: Diagnosis present

## 2020-10-05 DIAGNOSIS — I5021 Acute systolic (congestive) heart failure: Secondary | ICD-10-CM | POA: Diagnosis present

## 2020-10-05 DIAGNOSIS — O9081 Anemia of the puerperium: Secondary | ICD-10-CM | POA: Diagnosis present

## 2020-10-05 DIAGNOSIS — O864 Pyrexia of unknown origin following delivery: Secondary | ICD-10-CM | POA: Diagnosis present

## 2020-10-05 DIAGNOSIS — E8779 Other fluid overload: Secondary | ICD-10-CM | POA: Diagnosis not present

## 2020-10-05 DIAGNOSIS — U071 COVID-19: Secondary | ICD-10-CM | POA: Diagnosis present

## 2020-10-05 DIAGNOSIS — O99893 Other specified diseases and conditions complicating puerperium: Secondary | ICD-10-CM | POA: Diagnosis present

## 2020-10-05 LAB — C-REACTIVE PROTEIN: CRP: 19 mg/dL — ABNORMAL HIGH (ref <1.0)

## 2020-10-05 LAB — COMPREHENSIVE METABOLIC PANEL
ALT: 16 U/L (ref 0–44)
AST: 10 U/L — ABNORMAL LOW (ref 15–41)
Albumin: 2.1 g/dL — ABNORMAL LOW (ref 3.5–5.0)
Alkaline Phosphatase: 120 U/L (ref 38–126)
Anion gap: 12 (ref 5–15)
BUN: 5 mg/dL — ABNORMAL LOW (ref 6–20)
CO2: 24 mmol/L (ref 22–32)
Calcium: 8 mg/dL — ABNORMAL LOW (ref 8.9–10.3)
Chloride: 103 mmol/L (ref 98–111)
Creatinine, Ser: 0.77 mg/dL (ref 0.44–1.00)
GFR, Estimated: >60 mL/min (ref >60)
Glucose, Bld: 93 mg/dL (ref 70–99)
Potassium: 3 mmol/L — ABNORMAL LOW (ref 3.5–5.1)
Sodium: 139 mmol/L (ref 135–145)
Total Bilirubin: 0.8 mg/dL (ref 0.3–1.2)
Total Protein: 5.4 g/dL — ABNORMAL LOW (ref 6.5–8.1)

## 2020-10-05 LAB — URINALYSIS, ROUTINE W REFLEX MICROSCOPIC
Bilirubin Urine: NEGATIVE
Glucose, UA: NEGATIVE mg/dL
Hgb urine dipstick: NEGATIVE
Ketones, ur: 5 mg/dL — AB
Leukocytes,Ua: NEGATIVE
Nitrite: NEGATIVE
Protein, ur: NEGATIVE mg/dL
Specific Gravity, Urine: 1.017 (ref 1.005–1.030)
pH: 7 (ref 5.0–8.0)

## 2020-10-05 LAB — CBC WITH DIFFERENTIAL/PLATELET
Abs Immature Granulocytes: 0.37 10*3/uL — ABNORMAL HIGH (ref 0.00–0.07)
Basophils Absolute: 0 10*3/uL (ref 0.0–0.1)
Basophils Relative: 0 %
Eosinophils Absolute: 0.1 10*3/uL (ref 0.0–0.5)
Eosinophils Relative: 0 %
HCT: 28.3 % — ABNORMAL LOW (ref 36.0–46.0)
Hemoglobin: 9.1 g/dL — ABNORMAL LOW (ref 12.0–15.0)
Immature Granulocytes: 3 %
Lymphocytes Relative: 7 %
Lymphs Abs: 0.9 10*3/uL (ref 0.7–4.0)
MCH: 25 pg — ABNORMAL LOW (ref 26.0–34.0)
MCHC: 32.2 g/dL (ref 30.0–36.0)
MCV: 77.7 fL — ABNORMAL LOW (ref 80.0–100.0)
Monocytes Absolute: 1.2 10*3/uL — ABNORMAL HIGH (ref 0.1–1.0)
Monocytes Relative: 9 %
Neutro Abs: 11.6 10*3/uL — ABNORMAL HIGH (ref 1.7–7.7)
Neutrophils Relative %: 81 %
Platelets: 373 10*3/uL (ref 150–400)
RBC: 3.64 MIL/uL — ABNORMAL LOW (ref 3.87–5.11)
RDW: 21.4 % — ABNORMAL HIGH (ref 11.5–15.5)
WBC: 14.2 10*3/uL — ABNORMAL HIGH (ref 4.0–10.5)
nRBC: 0.5 % — ABNORMAL HIGH (ref 0.0–0.2)

## 2020-10-05 LAB — MAGNESIUM
Magnesium: 2 mg/dL (ref 1.7–2.4)
Magnesium: 1.9 mg/dL (ref 1.7–2.4)

## 2020-10-05 LAB — TROPONIN I (HIGH SENSITIVITY)
Troponin I (High Sensitivity): 50 ng/L — ABNORMAL HIGH (ref <18)
Troponin I (High Sensitivity): 15 ng/L (ref <18)
Troponin I (High Sensitivity): 17 ng/L (ref <18)
Troponin I (High Sensitivity): 17 ng/L (ref <18)

## 2020-10-05 LAB — FERRITIN: Ferritin: 77 ng/mL (ref 11–307)

## 2020-10-05 LAB — ECHOCARDIOGRAM COMPLETE
Area-P 1/2: 4.06 cm2
Height: 63 in
S' Lateral: 3.9 cm
Weight: 3326.3 oz

## 2020-10-05 LAB — PHOSPHORUS: Phosphorus: 3 mg/dL (ref 2.5–4.6)

## 2020-10-05 LAB — PROCALCITONIN: Procalcitonin: 0.62 ng/mL

## 2020-10-05 MED ORDER — OXYCODONE HCL 5 MG/5ML PO SOLN
5.0000 mg | ORAL | Status: DC | PRN
  Administered 2020-10-05: 5 mg via ORAL
  Filled 2020-10-05: qty 5

## 2020-10-05 MED ORDER — FUROSEMIDE 10 MG/ML IJ SOLN
40.0000 mg | Freq: Once | INTRAMUSCULAR | Status: AC
  Administered 2020-10-05: 40 mg via INTRAVENOUS
  Filled 2020-10-05: qty 4

## 2020-10-05 MED ORDER — FUROSEMIDE 10 MG/ML IJ SOLN: 20.0000 mg | Freq: Every day | INTRAMUSCULAR | Status: DC

## 2020-10-05 MED ORDER — ACETAMINOPHEN 160 MG/5ML PO SOLN
650.0000 mg | Freq: Four times a day (QID) | ORAL | Status: DC | PRN
  Administered 2020-10-05 – 2020-10-06 (×2): 650 mg via ORAL
  Filled 2020-10-05 (×2): qty 20.3

## 2020-10-05 MED ORDER — POTASSIUM CHLORIDE 20 MEQ PO PACK
40.0000 meq | PACK | Freq: Once | ORAL | Status: AC
  Administered 2020-10-05: 40 meq via ORAL
  Filled 2020-10-05: qty 2

## 2020-10-05 MED ORDER — FERROUS SULFATE 325 (65 FE) MG PO TABS: 325.0000 mg | ORAL_TABLET | Freq: Every day | ORAL | Status: DC

## 2020-10-05 MED ORDER — ACETAMINOPHEN 325 MG PO TABS: 650.0000 mg | ORAL_TABLET | Freq: Once | ORAL | Status: DC

## 2020-10-05 MED ORDER — PANTOPRAZOLE SODIUM 40 MG PO TBEC
40.0000 mg | DELAYED_RELEASE_TABLET | Freq: Every day | ORAL | Status: DC
  Administered 2020-10-05 – 2020-10-06 (×2): 40 mg via ORAL
  Filled 2020-10-05 (×2): qty 1

## 2020-10-05 MED ORDER — ACETAMINOPHEN 160 MG/5ML PO SOLN
650.0000 mg | Freq: Once | ORAL | Status: AC
  Administered 2020-10-05: 650 mg via ORAL
  Filled 2020-10-05: qty 20.3

## 2020-10-05 MED ORDER — FLUOXETINE HCL 20 MG PO CAPS
20.0000 mg | ORAL_CAPSULE | Freq: Every day | ORAL | Status: DC
  Administered 2020-10-05 – 2020-10-06 (×2): 20 mg via ORAL
  Filled 2020-10-05 (×2): qty 1

## 2020-10-05 MED ORDER — POTASSIUM CHLORIDE 10 MEQ/100ML IV SOLN
10.0000 meq | INTRAVENOUS | Status: AC
  Administered 2020-10-05 (×2): 10 meq via INTRAVENOUS
  Filled 2020-10-05 (×2): qty 100

## 2020-10-05 MED ORDER — FERROUS SULFATE 325 (65 FE) MG PO TABS
325.0000 mg | ORAL_TABLET | Freq: Two times a day (BID) | ORAL | Status: DC
  Administered 2020-10-05 – 2020-10-06 (×3): 325 mg via ORAL
  Filled 2020-10-05 (×3): qty 1

## 2020-10-05 NOTE — Progress Notes (Signed)
Date and time results received: 10/05/20 0115   Test: Troponin Critical Value: 50  Name of Provider Notified: Dr. Roel Cluck  Orders Received? Or Actions Taken?: No new orders

## 2020-10-05 NOTE — Progress Notes (Signed)
PROGRESS NOTE                                                                                                                                                                                                             Patient Demographics:    Jennifer Summers, is a 40 y.o. female, DOB - 21-May-1981, CNO:[id number]  Outpatient Primary MD for the patient is Midge Minium, MD   Admit date - 10/04/2020   LOS - 1  Chief Complaint  Patient presents with  . Shortness of Breath       Brief Narrative: Patient is a 40 y.o. female with PMHx of HTN, protein S deficiency, prediabetes, Covid positive since 2/14-s/p C-section on 2/15-presented to the hospital with exertional dyspnea, worsening lower extremity swelling and fever.  COVID-19 vaccinated status: Vaccinated but not boosted  Significant Events: 2/20>> Admit to [facility] for evaluation of fever/lower extremity swelling and exertional dyspnea  Significant studies: 2/20>> CTA chest: No PE-no pneumonia. 2/20>> bilateral lower extremity Doppler: Negative for DVT. 2/21>> chest x-ray: No obvious pneumonia (personally reviewed)  COVID-19 medications: Remdesivir: 2/20>>  Antibiotics: None  Microbiology data: 2/20>>Blood culture: No growth 2/22>> blood culture: Pending  Procedures: None  Consults: None  DVT prophylaxis: enoxaparin (LOVENOX) injection 40 mg Start: 10/04/20 2200    Subjective:    Jennifer Summers today is lying comfortably in bed.  She is not on oxygen.  She has some pain at her C-section incision site-denies any excessive vaginal discharge.  Denies any lower abdominal pain.  No nausea vomiting.   Assessment  & Plan :   SIRS: Unclear source of infection at this point-although she does have Covid 19 infection-she does not have significant infiltrates in chest x-ray/CTA chest.  She continues to have significant leukocytosis-discussed with OB-regarding  concerns for possibly early/developing endometritis.  Plans are to monitor off antibiotics given her overall stability-and follow closely.  Check UA-repeat blood cultures today-recheck chest x-ray in a.m.  COVID-19 infection: Not hypoxic-imaging without any obvious infiltrates-however CRP significantly elevated-unclear if fever/SIRS is from COVID at this point.  No role for steroids-continue Remdesivir-repeat chest x-ray tomorrow morning-if has has developed infiltrates-then may need to extend Remdesivir x5 days.   Recent Labs    10/03/20 1215 10/03/20 1528 10/03/20 1528 10/04/20 1620 10/04/20 2154 10/05/20 0525  DDIMER 4.47*  --   --  5.12*  --   --   FERRITIN  --  91  --   --  75 77  LDH  --   --   --   --  259*  --   CRP  --  19.4*   < > 18.9* 19.6* 19.0*   < > = values in this interval not displayed.    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  Check UA to rule out proteinuria-does have significantly low albumin levels that could be contributing to edema.  Discussed with OB-some amount of edema as expected postpartum-however she currently has 3+ pitting edema up to her thighs-and it seems disproportionate to the amount of edema expected post C-section.  Per OB-unlikely to be preeclampsia as well as her blood pressure has not been persistently high.  Repeat dosing of IV Lasix today  Hypokalemia: Replete and recheck.  Microcytic anemia-ferritin levels on the lower side-suspect this is iron deficiency.  Continue with iron supplementation  History of protein S deficiency: On prophylactic Lovenox-apparently no history of VTE in the past per patient.  Recent Doppler/CTA chest negative.  Continue to monitor closely.  HTN: Amlodipine on hold-on labetalol as needed.  Morbid Obesity: Estimated body mass index is 36.83 kg/m as calculated from the  following:   Height as of this encounter: 5\' 3"  (1.6 m).   Weight as of this encounter: 94.3 kg.    GI prophylaxis: PPI  ABG: No results found for: PHART, PCO2ART, PO2ART, HCO3, TCO2, ACIDBASEDEF, O2SAT  Vent Settings: N/A   Condition - Stable  Family Communication  :    Code Status :  Full Code  Diet :  Diet Order            Diet Heart Room service appropriate? Yes; Fluid consistency: Thin  Diet effective now                  Disposition Plan  :   Status is: Observation  The patient will require care spanning > 2 midnights and should be moved to inpatient because: Inpatient level of care appropriate due to severity of illness  Dispo: The patient is from: Home              Anticipated d/c is to: Home              Anticipated d/c date is: 3 days              Patient currently is not medically stable to d/c.   Difficult to place patient No    Barriers to discharge: Fever//edema-needs inpatient work-up to rule out other causes-needs continued inpatient monitoring-needs continued IV Lasix for volume management-May need to be started on IV antibiotics-see above.  Antimicorbials  :    Anti-infectives (From admission, onward)   Start     Dose/Rate Route Frequency Ordered Stop   10/05/20 1000  remdesivir 100 mg in sodium chloride 0.9 % 100 mL IVPB  Status:  Discontinued       "Followed by" Linked Group Details   100 mg 200 mL/hr over 30 Minutes Intravenous Daily 10/04/20 2110 10/04/20 2120   10/04/20 2200  remdesivir 200 mg in sodium chloride 0.9% 250 mL IVPB  Status:  Discontinued       "Followed by" Linked Group Details   200 mg 580 mL/hr over 30 Minutes Intravenous Once 10/04/20 2110 10/04/20 2120  10/04/20 2200  remdesivir 100 mg in sodium chloride 0.9 % 100 mL IVPB        100 mg 200 mL/hr over 30 Minutes Intravenous Every 24 hours 10/04/20 2120 10/08/20 2159      Inpatient Medications  Scheduled Meds: . enoxaparin (LOVENOX) injection  40 mg Subcutaneous  Q24H  . ferrous sulfate  325 mg Oral Daily  . FLUoxetine  20 mg Oral Daily  . furosemide  20 mg Intravenous Daily  . pantoprazole  40 mg Oral Daily   Continuous Infusions: . lactated ringers Stopped (10/05/20 0130)  . remdesivir 100 mg in NS 100 mL 100 mg (10/04/20 2313)   PRN Meds:.albuterol, guaiFENesin-dextromethorphan, labetalol **AND** labetalol **AND** labetalol **AND** hydrALAZINE **AND** Measure blood pressure, oxyCODONE   Time Spent in minutes  35   See all Orders from today for further details   Oren Binet M.D on 10/05/2020 at 10:09 AM  To page go to [email or web address] - use universal password  [facility] -  Office  [phone]    Objective:   Vitals:   10/05/20 0300 10/05/20 0542 10/05/20 0607 10/05/20 0816  BP:  (!) 151/77  136/72  Pulse:  88  81  Resp:    (!) 22  Temp:  (!) 101.2 F (38.4 C)  98.2 F (36.8 C)  TempSrc:  Oral  Oral  SpO2: 96% 95% 95% 95%  Weight:      Height:        Wt Readings from Last 3 Encounters:  10/04/20 94.3 kg  09/28/20 92.5 kg  02/04/20 71.5 kg     Intake/Output Summary (Last 24 hours) at 10/05/2020 1009 Last data filed at 10/05/2020 0800 Gross per 24 hour  Intake 410.2 ml  Output 850 ml  Net -439.8 ml     Physical Exam Gen Exam:Alert awake-not in any distress HEENT:atraumatic, normocephalic Chest: B/L clear to auscultation anteriorly CVS:S1S2 regular Abdomen:soft non tender, non distended Extremities:+++ edema Neurology: Non focal Skin: no rash   Data Review:    CBC Recent Labs  Lab 09/29/20 0535 10/03/20 1215 10/04/20 1620 10/04/20 2154 10/05/20 0525  WBC 11.2* 20.0* 14.3* 13.4* 14.2*  HGB 9.1* 9.1* 8.9* 9.4* 9.1*  HCT 28.2* 29.5* 28.8* 30.9* 28.3*  PLT 280 334 375 359 373  MCV 77.5* 79.1* 78.5* 78.8* 77.7*  MCH 25.0* 24.4* 24.3* 24.0* 25.0*  MCHC 32.3 30.8 30.9 30.4 32.2  RDW 23.2* 21.9* 21.5* 21.5* 21.4*  LYMPHSABS  --  1.0  --  0.8 0.9  MONOABS  --  1.5*  --  0.5 1.2*  EOSABS  --   0.1  --  0.3 0.1  BASOSABS  --  0.0  --  0.0 0.0    Chemistries  Recent Labs  Lab 10/03/20 1215 10/04/20 1620 10/05/20 0135 10/05/20 0525  NA 135 139  --  139  K 3.1* 2.8*  --  3.0*  CL 100 103  --  103  CO2 25 25  --  24  GLUCOSE 84 87  --  93  BUN 7 7  --  5*  CREATININE 0.71 0.75  --  0.77  CALCIUM 7.9* 8.0*  --  8.0*  MG  --   --  2.0 1.9  AST 12* 11*  --  10*  ALT 23 18  --  16  ALKPHOS 102 127*  --  120  BILITOT 0.8 0.8  --  0.8   ------------------------------------------------------------------------------------------------------------------ No results for input(s): CHOL, HDL, LDLCALC, TRIG, CHOLHDL, LDLDIRECT in the  last 72 hours.  No results found for: HGBA1C ------------------------------------------------------------------------------------------------------------------ Recent Labs    10/03/20 1528  TSH 0.982   ------------------------------------------------------------------------------------------------------------------ Recent Labs    10/04/20 2154 10/05/20 0525  FERRITIN 75 77    Coagulation profile No results for input(s): INR, PROTIME in the last 168 hours.  Recent Labs    10/03/20 1215 10/04/20 1620  DDIMER 4.47* 5.12*    Cardiac Enzymes No results for input(s): CKMB, TROPONINI, MYOGLOBIN in the last 168 hours.  Invalid input(s): CK ------------------------------------------------------------------------------------------------------------------    Component Value Date/Time   BNP 268.0 (H) 10/04/2020 1620    Micro Results Recent Results (from the past 240 hour(s))  SARS CORONAVIRUS 2 (TAT 6-24 HRS) Nasopharyngeal Nasopharyngeal Swab     Status: Abnormal   Collection Time: 09/27/20 10:10 AM   Specimen: Nasopharyngeal Swab  Result Value Ref Range Status   SARS Coronavirus 2 POSITIVE (A) NEGATIVE Final    Comment: (NOTE) SARS-CoV-2 target nucleic acids are DETECTED.  The SARS-CoV-2 RNA is generally detectable in upper and  lower respiratory specimens during the acute phase of infection. Positive results are indicative of the presence of SARS-CoV-2 RNA. Clinical correlation with patient history and other diagnostic information is  necessary to determine patient infection status. Positive results do not rule out bacterial infection or co-infection with other viruses.  The expected result is Negative.  Fact Sheet for Patients: SugarRoll.be  Fact Sheet for Healthcare Providers: https://www.woods-mathews.com/  This test is not yet approved or cleared by the [place] FDA and  has been authorized for detection and/or diagnosis of SARS-CoV-2 by FDA under an Emergency Use Authorization (EUA). This EUA will remain  in effect (meaning this test can be used) for the duration of the COVID-19 declaration under Section 564(b)(1) of the Act, 21 U. S.C. section 360bbb-3(b)(1), unless the authorization is terminated or revoked sooner.   Performed at [facility], [place] [address], [place], [place] [address]   Culture, blood (Routine X 2) w Reflex to ID Panel     Status: None (Preliminary result)   Collection Time: 10/03/20  2:12 PM   Specimen: BLOOD  Result Value Ref Range Status   Specimen Description BLOOD SITE NOT SPECIFIED  Final   Special Requests   Final    BOTTLES DRAWN AEROBIC AND ANAEROBIC Blood Culture results may not be optimal due to an inadequate volume of blood received in culture bottles   Culture   Final    NO GROWTH 2 DAYS Performed at [facility], [place] [address], [place],  [address]    Report Status PENDING  Incomplete    Radiology Reports CT Angio Chest PE W and/or Wo Contrast  Result Date: 10/03/2020 CLINICAL DATA:  Shortness of breath and bilateral lower extremity swelling for 2 days. Elevated D-dimer. EXAM: CT ANGIOGRAPHY CHEST WITH CONTRAST TECHNIQUE: Multidetector CT imaging of the chest was performed using the standard  protocol during bolus administration of intravenous contrast. Multiplanar CT image reconstructions and MIPs were obtained to evaluate the vascular anatomy. CONTRAST:  80 mL OMNIPAQUE IOHEXOL 350 MG/ML SOLN COMPARISON:  Single-view of the chest today. FINDINGS: Cardiovascular: No pulmonary embolus is identified. No aneurysm. Heart size is upper normal. No pericardial effusion. Mediastinum/Nodes: No enlarged mediastinal, hilar, or axillary lymph nodes. Thyroid gland, trachea, and esophagus demonstrate no significant findings. Lungs/Pleura: No pleural effusion. There is mild dependent atelectasis. Lungs otherwise clear. Upper Abdomen: Negative. Musculoskeletal: Negative. Review of the MIP images confirms the above findings. IMPRESSION: Negative for  pulmonary embolus.  Negative chest CT. Electronically Signed   By: Inge Rise M.D.   On: 10/03/2020 13:15   DG CHEST PORT 1 VIEW  Result Date: 10/04/2020 CLINICAL DATA:  Shortness of breath COVID September 27, 2020 EXAM: PORTABLE CHEST 1 VIEW COMPARISON:  Chest radiograph October 03, 2020 and CT a chest October 03, 2020 FINDINGS: The heart size and mediastinal contours are unchanged. Right basilar platelike atelectasis. No focal consolidation. No pleural effusion. No visible pneumothorax. The visualized skeletal structures are unchanged. IMPRESSION: Right basilar platelike atelectasis, no new focal consolidation. The. Electronically Signed   By: Dahlia Bailiff MD   On: 10/04/2020 16:14   DG Chest Portable 1 View  Result Date: 10/03/2020 CLINICAL DATA:  Shortness of breath and leg swelling. EXAM: PORTABLE CHEST 1 VIEW COMPARISON:  None. FINDINGS: The cardiomediastinal silhouette is unremarkable. Mild pulmonary vascular congestion is noted. Opacities/atelectasis in the MEDIAL RIGHT LOWER lung noted. No pleural effusion or pneumothorax noted. No acute bony abnormalities are present. IMPRESSION: RIGHT LOWER lung opacities versus atelectasis. This may represent  infection/pneumonia. Mild pulmonary vascular congestion. Electronically Signed   By: Margarette Canada M.D.   On: 10/03/2020 11:49   VAS [place] LOWER EXTREMITY VENOUS (DVT)  Result Date: 10/04/2020  Lower Venous DVT Study Indications: Swelling, SOB, and Covid-19, history of Protein S deficiency.  Risk Factors: Surgery C-Section 09/28/20. Limitations: Edema, pain from staples. Comparison Study: No prior study Performing Technologist: Sharion Dove RVS  Examination Guidelines: A complete evaluation includes B-mode imaging, spectral Doppler, color Doppler, and power Doppler as needed of all accessible portions of each vessel. Bilateral testing is considered an integral part of a complete examination. Limited examinations for reoccurring indications may be performed as noted. The reflux portion of the exam is performed with the patient in reverse Trendelenburg.  +---------+---------------+---------+-----------+----------+--------------+ RIGHT    CompressibilityPhasicitySpontaneityPropertiesThrombus Aging +---------+---------------+---------+-----------+----------+--------------+ CFV      Full                                         pulsatile flow +---------+---------------+---------+-----------+----------+--------------+ SFJ      Full                                                        +---------+---------------+---------+-----------+----------+--------------+ FV Prox  Full                                                        +---------+---------------+---------+-----------+----------+--------------+ FV Mid   Full                                                        +---------+---------------+---------+-----------+----------+--------------+ FV DistalFull                                                        +---------+---------------+---------+-----------+----------+--------------+  PFV      Full                                                         +---------+---------------+---------+-----------+----------+--------------+ POP      Full                                         pulsatile flow +---------+---------------+---------+-----------+----------+--------------+ PTV      Full                                                        +---------+---------------+---------+-----------+----------+--------------+ PERO     Full                                                        +---------+---------------+---------+-----------+----------+--------------+   +---------+---------------+---------+-----------+----------+--------------+ LEFT     CompressibilityPhasicitySpontaneityPropertiesThrombus Aging +---------+---------------+---------+-----------+----------+--------------+ CFV      Full           Yes      Yes                  pulsatile flow +---------+---------------+---------+-----------+----------+--------------+ SFJ      Full                                                        +---------+---------------+---------+-----------+----------+--------------+ FV Prox  Full                                                        +---------+---------------+---------+-----------+----------+--------------+ FV Mid   Full                                                        +---------+---------------+---------+-----------+----------+--------------+ FV DistalFull                                                        +---------+---------------+---------+-----------+----------+--------------+ PFV      Full                                                        +---------+---------------+---------+-----------+----------+--------------+ POP  Full                                         pulsatile flow +---------+---------------+---------+-----------+----------+--------------+ PTV      Full                                                         +---------+---------------+---------+-----------+----------+--------------+ PERO     Full                                                        +---------+---------------+---------+-----------+----------+--------------+     Summary: BILATERAL: - No evidence of deep vein thrombosis seen in the lower extremities, bilaterally. - RIGHT: pulsatile flow suggestive of fluid overload  LEFT: Pulsatile flow suggestive of fluid overload.  *See table(s) above for measurements and observations. Electronically signed by Ruta Hinds MD on 10/04/2020 at 5:56:48 PM.    Final

## 2020-10-05 NOTE — Progress Notes (Signed)
S: Patient reports feeling well overall. Continues to have cough. SOB only present when laying/walking. Sitting straight up in bed feels well. Denies abdominal pain - reports incision actually minimally tender.   O:  Vitals:   10/05/20 0607 10/05/20 0816  BP:  136/72  Pulse:  81  Resp:  (!) 22  Temp:  98.2 F (36.8 C)  SpO2: 95% 95%   Gen: NAD sitting up in bed, conversive Skin: warm and dry CV: Reg rate Pulm: NWOB Abdomen: soft, midline incision healing well, staples in place. Some mild edema around staples where skin gathers and hangs but no induration or erythema LE:  3+ pitting edema below knees bilaterally  A/P: 40 yo [id number] presenting POD#6 s/p cesarean section with symptomatic covid. Admitted by Internal medicine team for management of her SOB/COVID.   # PPD #6 from repeat cesarean section- exam reassuring and benign - some incisional edema in panniculus.   # bilateral pitting edema of legs: postpartum swelling suspected but cannot exclude pp cardiomyopathy contributing to third spacing.  - s/p lasix PO - elevation of legs encouraged - ambulation as able encouraged - No persistent elevation in Bps, no transaminitis and thrombocytopenia - no s/s PIH.   # Fever - T max overnight 101. Now AF. It is likely s/s COVID infection.Cannot completley exclude endometritis. However, pt w/out significant abdominal tenderness, foul lochia, etc. Cannot completely exclude early cellulitis but exam currently w/out s/s except mild edema (which is typically expected given location of her incision in her panus). Likely too early for imaging to evaluate for an abscess. Also - given benign exam and active covid infection, I suspect this is unlikely. Can consider CT scan if clinically not improving.   # Covid positive - medicine team following. Remdesmivir.   # Protein S deficiency - continue lovenox 40mg  qd  # SOB - Remains symptomatic but only while flat. Neg CTA (high risk s/s Protein S  deficiency).  There could be a component of post partum cardiomyopathy in addition to Covid-19. Management per medicine. They are planning an ECHO   I spoke with Dr. Sloan Leiter. We will continue to follow and help as needed.  Arty Baumgartner MD

## 2020-10-05 NOTE — Progress Notes (Signed)
  Echocardiogram 2D Echocardiogram with 3D has been performed.  Jennifer Summers M 10/05/2020, 9:47 AM

## 2020-10-06 ENCOUNTER — Inpatient Hospital Stay (HOSPITAL_COMMUNITY): Payer: No Typology Code available for payment source

## 2020-10-06 ENCOUNTER — Other Ambulatory Visit: Payer: Self-pay | Admitting: Internal Medicine

## 2020-10-06 DIAGNOSIS — U071 COVID-19: Secondary | ICD-10-CM | POA: Diagnosis not present

## 2020-10-06 DIAGNOSIS — E8779 Other fluid overload: Secondary | ICD-10-CM | POA: Diagnosis not present

## 2020-10-06 DIAGNOSIS — D6859 Other primary thrombophilia: Secondary | ICD-10-CM | POA: Diagnosis not present

## 2020-10-06 DIAGNOSIS — J810 Acute pulmonary edema: Secondary | ICD-10-CM | POA: Diagnosis not present

## 2020-10-06 LAB — COMPREHENSIVE METABOLIC PANEL
ALT: 13 U/L (ref 0–44)
AST: 13 U/L — ABNORMAL LOW (ref 15–41)
Albumin: 2.3 g/dL — ABNORMAL LOW (ref 3.5–5.0)
Alkaline Phosphatase: 107 U/L (ref 38–126)
Anion gap: 13 (ref 5–15)
BUN: 5 mg/dL — ABNORMAL LOW (ref 6–20)
CO2: 24 mmol/L (ref 22–32)
Calcium: 8.3 mg/dL — ABNORMAL LOW (ref 8.9–10.3)
Chloride: 105 mmol/L (ref 98–111)
Creatinine, Ser: 0.76 mg/dL (ref 0.44–1.00)
GFR, Estimated: >60 mL/min (ref >60)
Glucose, Bld: 87 mg/dL (ref 70–99)
Potassium: 3.1 mmol/L — ABNORMAL LOW (ref 3.5–5.1)
Sodium: 142 mmol/L (ref 135–145)
Total Bilirubin: 0.6 mg/dL (ref 0.3–1.2)
Total Protein: 5.7 g/dL — ABNORMAL LOW (ref 6.5–8.1)

## 2020-10-06 LAB — PHOSPHORUS: Phosphorus: 3.6 mg/dL (ref 2.5–4.6)

## 2020-10-06 LAB — CBC WITH DIFFERENTIAL/PLATELET
Abs Immature Granulocytes: 0.45 10*3/uL — ABNORMAL HIGH (ref 0.00–0.07)
Basophils Absolute: 0 10*3/uL (ref 0.0–0.1)
Basophils Relative: 0 %
Eosinophils Absolute: 0.1 10*3/uL (ref 0.0–0.5)
Eosinophils Relative: 1 %
HCT: 32.9 % — ABNORMAL LOW (ref 36.0–46.0)
Hemoglobin: 10.1 g/dL — ABNORMAL LOW (ref 12.0–15.0)
Immature Granulocytes: 4 %
Lymphocytes Relative: 10 %
Lymphs Abs: 1.2 10*3/uL (ref 0.7–4.0)
MCH: 24.5 pg — ABNORMAL LOW (ref 26.0–34.0)
MCHC: 30.7 g/dL (ref 30.0–36.0)
MCV: 79.9 fL — ABNORMAL LOW (ref 80.0–100.0)
Monocytes Absolute: 0.9 10*3/uL (ref 0.1–1.0)
Monocytes Relative: 8 %
Neutro Abs: 8.5 10*3/uL — ABNORMAL HIGH (ref 1.7–7.7)
Neutrophils Relative %: 77 %
Platelets: 381 10*3/uL (ref 150–400)
RBC: 4.12 MIL/uL (ref 3.87–5.11)
RDW: 21.2 % — ABNORMAL HIGH (ref 11.5–15.5)
WBC: 11.1 10*3/uL — ABNORMAL HIGH (ref 4.0–10.5)
nRBC: 0.7 % — ABNORMAL HIGH (ref 0.0–0.2)

## 2020-10-06 LAB — C-REACTIVE PROTEIN: CRP: 14.8 mg/dL — ABNORMAL HIGH (ref <1.0)

## 2020-10-06 LAB — FERRITIN: Ferritin: 92 ng/mL (ref 11–307)

## 2020-10-06 LAB — MAGNESIUM: Magnesium: 2 mg/dL (ref 1.7–2.4)

## 2020-10-06 MED ORDER — FUROSEMIDE 10 MG/ML IJ SOLN
40.0000 mg | Freq: Once | INTRAMUSCULAR | Status: AC
  Administered 2020-10-06: 40 mg via INTRAVENOUS
  Filled 2020-10-06: qty 4

## 2020-10-06 MED ORDER — POTASSIUM CHLORIDE 20 MEQ PO PACK
40.0000 meq | PACK | Freq: Once | ORAL | Status: AC
  Administered 2020-10-06: 40 meq via ORAL
  Filled 2020-10-06: qty 2

## 2020-10-06 MED ORDER — FUROSEMIDE 40 MG PO TABS: 40.0000 mg | ORAL_TABLET | Freq: Every day | ORAL | 0 refills | Status: DC

## 2020-10-06 MED ORDER — METOPROLOL TARTRATE 25 MG PO TABS: 25.0000 mg | ORAL_TABLET | Freq: Two times a day (BID) | ORAL | 0 refills | Status: DC

## 2020-10-06 MED ORDER — POTASSIUM CHLORIDE ER 20 MEQ PO TBCR: 40.0000 meq | EXTENDED_RELEASE_TABLET | Freq: Every day | ORAL | 0 refills | Status: DC

## 2020-10-06 MED ORDER — POTASSIUM CHLORIDE CRYS ER 20 MEQ PO TBCR
40.0000 meq | EXTENDED_RELEASE_TABLET | Freq: Once | ORAL | Status: DC
  Filled 2020-10-06: qty 2

## 2020-10-06 MED ORDER — METOPROLOL TARTRATE 25 MG PO TABS
25.0000 mg | ORAL_TABLET | Freq: Two times a day (BID) | ORAL | Status: DC
  Administered 2020-10-06: 25 mg via ORAL
  Filled 2020-10-06: qty 1

## 2020-10-06 MED FILL — METOPROLOL TARTRATE 25 MG T: 25 | 30 days supply | Qty: 60 | Fill #0

## 2020-10-06 MED FILL — POTASSIUM CHLORIDE 20meqER: 20 | 30 days supply | Qty: 60 | Fill #0

## 2020-10-06 MED FILL — FUROSEMIDE 40 MG TABLET: 40 | 30 days supply | Qty: 30 | Fill #0

## 2020-10-06 NOTE — Progress Notes (Signed)
Patient scheduled for outpatient Remdesivir infusions at 1:30 pm on Thursday 2/25 at [facility]. Please inform the patient to park at [place], as staff will be escorting the patient through the [place] entrance of the hospital. Appointments take approximately 45 minutes.    There is a wave flag banner located near the entrance on [address]. Turn into this entrance and immediately turn left or right and park in 1 of the 10 designated Covid Infusion Parking spots. There is a phone number on the sign, please call and let the staff know what spot you are in and we will come out and get you. For questions call [phone].  Thanks.   Icarus Partch Lorita Officer, RN

## 2020-10-06 NOTE — Discharge Instructions (Signed)
Patient scheduled for outpatient Remdesivir infusions at 1:30 pm on Thursday 2/25 at [facility]. Please inform the patient to park at [place], as staff will be escorting the patient through the [place] entrance of the hospital. Appointments take approximately 45 minutes.    There is a wave flag banner located near the entrance on [address]. Turn into this entrance and immediately turn left or right and park in 1 of the 10 designated Covid Infusion Parking spots. There is a phone number on the sign, please call and let the staff know what spot you are in and we will come out and get you. For questions call [phone].  Thanks.      Person Under Monitoring Name: Jennifer Summers  Location: [address] [id number]   Infection Prevention Recommendations for Individuals Confirmed to have, or Being Evaluated for, 2019 Novel Coronavirus (COVID-19) Infection Who Receive Care at Home  Individuals who are confirmed to have, or are being evaluated for, COVID-19 should follow the prevention steps below until a healthcare provider or local or state health department says they can return to normal activities.  Stay home except to get medical care You should restrict activities outside your home, except for getting medical care. Do not go to work, school, or public areas, and do not use public transportation or taxis.  Call ahead before visiting your doctor Before your medical appointment, call the healthcare provider and tell them that you have, or are being evaluated for, COVID-19 infection. This will help the healthcare provider's office take steps to keep other people from getting infected. Ask your healthcare provider to call the local or state health department.  Monitor your symptoms Seek prompt medical attention if your illness is worsening (e.g., difficulty breathing). Before going to your medical appointment, call the healthcare provider  and tell them that you have, or are being evaluated for, COVID-19 infection. Ask your healthcare provider to call the local or state health department.  Wear a facemask You should wear a facemask that covers your nose and mouth when you are in the same room with other people and when you visit a healthcare provider. People who live with or visit you should also wear a facemask while they are in the same room with you.  Separate yourself from other people in your home As much as possible, you should stay in a different room from other people in your home. Also, you should use a separate bathroom, if available.  Avoid sharing household items You should not share dishes, drinking glasses, cups, eating utensils, towels, bedding, or other items with other people in your home. After using these items, you should wash them thoroughly with soap and water.  Cover your coughs and sneezes Cover your mouth and nose with a tissue when you cough or sneeze, or you can cough or sneeze into your sleeve. Throw used tissues in a lined trash can, and immediately wash your hands with soap and water for at least 20 seconds or use an alcohol-based hand rub.  Wash your [facility] your hands often and thoroughly with soap and water for at least 20 seconds. You can use an alcohol-based hand sanitizer if soap and water are not available and if your hands are not visibly dirty. Avoid touching your eyes, nose, and mouth with unwashed hands.   Prevention Steps for Caregivers and Household Members of Individuals Confirmed to have, or Being Evaluated for, COVID-19 Infection  Being Cared for in the Home  If you live with, or provide care at home for, a person confirmed to have, or being evaluated for, COVID-19 infection please follow these guidelines to prevent infection:  Follow healthcare provider's instructions Make sure that you understand and can help the patient follow any healthcare provider instructions for  all care.  Provide for the patient's basic needs You should help the patient with basic needs in the home and provide support for getting groceries, prescriptions, and other personal needs.  Monitor the patient's symptoms If they are getting sicker, call his or her medical provider and tell them that the patient has, or is being evaluated for, COVID-19 infection. This will help the healthcare provider's office take steps to keep other people from getting infected. Ask the healthcare provider to call the local or state health department.  Limit the number of people who have contact with the patient  If possible, have only one caregiver for the patient.  Other household members should stay in another home or place of residence. If this is not possible, they should stay  in another room, or be separated from the patient as much as possible. Use a separate bathroom, if available.  Restrict visitors who do not have an essential need to be in the home.  Keep older adults, very young children, and other sick people away from the patient Keep older adults, very young children, and those who have compromised immune systems or chronic health conditions away from the patient. This includes people with chronic heart, lung, or kidney conditions, diabetes, and cancer.  Ensure good ventilation Make sure that shared spaces in the home have good air flow, such as from an air conditioner or an opened window, weather permitting.  Wash your hands often  Wash your hands often and thoroughly with soap and water for at least 20 seconds. You can use an alcohol based hand sanitizer if soap and water are not available and if your hands are not visibly dirty.  Avoid touching your eyes, nose, and mouth with unwashed hands.  Use disposable paper towels to dry your hands. If not available, use dedicated cloth towels and replace them when they become wet.  Wear a facemask and gloves  Wear a disposable  facemask at all times in the room and gloves when you touch or have contact with the patient's blood, body fluids, and/or secretions or excretions, such as sweat, saliva, sputum, nasal mucus, vomit, urine, or feces.  Ensure the mask fits over your nose and mouth tightly, and do not touch it during use.  Throw out disposable facemasks and gloves after using them. Do not reuse.  Wash your hands immediately after removing your facemask and gloves.  If your personal clothing becomes contaminated, carefully remove clothing and launder. Wash your hands after handling contaminated clothing.  Place all used disposable facemasks, gloves, and other waste in a lined container before disposing them with other household waste.  Remove gloves and wash your hands immediately after handling these items.  Do not share dishes, glasses, or other household items with the patient  Avoid sharing household items. You should not share dishes, drinking glasses, cups, eating utensils, towels, bedding, or other items with a patient who is confirmed to have, or being evaluated for, COVID-19 infection.  After the person uses these items, you should wash them thoroughly with soap and water.  Wash laundry thoroughly  Immediately remove and wash clothes or bedding that have blood, body fluids,  and/or secretions or excretions, such as sweat, saliva, sputum, nasal mucus, vomit, urine, or feces, on them.  Wear gloves when handling laundry from the patient.  Read and follow directions on labels of laundry or clothing items and detergent. In general, wash and dry with the warmest temperatures recommended on the label.  Clean all areas the individual has used often  Clean all touchable surfaces, such as counters, tabletops, doorknobs, bathroom fixtures, toilets, phones, keyboards, tablets, and bedside tables, every day. Also, clean any surfaces that may have blood, body fluids, and/or secretions or excretions on them.  Wear  gloves when cleaning surfaces the patient has come in contact with.  Use a diluted bleach solution (e.g., dilute bleach with 1 part bleach and 10 parts water) or a household disinfectant with a label that says EPA-registered for coronaviruses. To make a bleach solution at home, add 1 tablespoon of bleach to 1 quart (4 cups) of water. For a larger supply, add  cup of bleach to 1 gallon (16 cups) of water.  Read labels of cleaning products and follow recommendations provided on product labels. Labels contain instructions for safe and effective use of the cleaning product including precautions you should take when applying the product, such as wearing gloves or eye protection and making sure you have good ventilation during use of the product.  Remove gloves and wash hands immediately after cleaning.  Monitor yourself for signs and symptoms of illness Caregivers and household members are considered close contacts, should monitor their health, and will be asked to limit movement outside of the home to the extent possible. Follow the monitoring steps for close contacts listed on the symptom monitoring form.   ? If you have additional questions, contact your local health department or call the epidemiologist on call at [phone] (available 24/7). ? This guidance is subject to change. For the most up-to-date guidance from [facility], please refer to their website: [email or web address]

## 2020-10-06 NOTE — Discharge Summary (Signed)
PATIENT DETAILS Name: Jennifer Summers Age: 39 y.o. Sex: female Date of Birth: 1980/08/27 MRN: [id number]. Admitting Physician: Toy Baker, MD GXQ:[id number], Aundra Millet, MD  Admit Date: 10/04/2020 Discharge date: 10/06/2020  Recommendations for Outpatient Follow-up:  1. Follow up with PCP in 1-2 weeks 2. Please obtain CMP/CBC in one week 3. Please ensure follow-up with OB/GYN, cardiology 4. Please follow blood cultures until final  Admitted From:  Home  Disposition: [place]: No  Equipment/Devices: None  Discharge Condition: Stable  CODE STATUS: FULL CODE  Diet recommendation:  Diet Order            Diet - low sodium heart healthy           Diet Heart Room service appropriate? Yes; Fluid consistency: Thin  Diet effective now                  Brief Narrative: Patient is a 40 y.o. female with PMHx of HTN, protein S deficiency, prediabetes, Covid positive since 2/14-s/p C-section on 2/15-presented to the hospital with exertional dyspnea, worsening lower extremity swelling and fever.  COVID-19 vaccinated status: Vaccinated but not boosted  Significant Events: 2/20>> Admit to [facility] for evaluation of fever/lower extremity swelling and exertional dyspnea  Significant studies: 2/20>> CTA chest: No PE-no pneumonia. 2/20>> bilateral lower extremity Doppler: Negative for DVT. 2/21>> chest x-ray: No obvious pneumonia (personally reviewed) 2/22>> Echo: EF 50-55%--LV with low normal systolic function. 2/23>> chest x-ray: No obvious pneumonia  COVID-19 medications: Remdesivir: 2/20>>  Antibiotics: None  Microbiology data: 2/20>>Blood culture: No growth 2/22>> blood culture: No growth  Procedures: None  Consults: OB, phone consult-cardiology  Brief Hospital Course: SIRS:  Although-she does have Covid infection-unclear whether fever is from COVID-19 infection-imaging studies does not show any obvious infiltrates.  UA/blood cultures  remain negative.  She was monitored closely-she was started on Remdesivir but not on any antibiotics.  Her last episode of fever was yesterday afternoon-plans were to continue to monitor her closely for another day or so-however this morning she is insisting on being discharged-and is being discharged at her own request (AGAINST MEDICAL ADVICE-see prior note).  There was some concern given leukocytosis-no obvious pneumonia on x-ray that she may have another source of infection like early/developing endometritis-however patient does not wish to remain hospitalized any longer.  She will follow with her primary care practitioner-primary OB MD-and if needed will return to the hospital.  Since her source of infection is unclear-and her leukocytosis improving-I do not think she requires antimicrobial therapy on discharge apart from Remdesivir (fifth dose scheduled for Friday at the infusion center by this MD).  She will need to be closely monitored by her outpatient MD-and started on antibiotics if a source of infection becomes apparent.  COVID-19 infection: Not hypoxic-imaging without any obvious infiltrates-however CRP significantly elevated (could be from recent C-section)-unclear if fever/SIRS is from COVID at this point.  No role for steroids-plans were to continue with Remdesivir and follow clinically for any other sources of infection.  However as noted above-patient adamant on leaving the hospital today.  I have reached out to the Remdesivir infusion center-they will give her her fifth dose of infusion on Friday.    COVID-19 Labs:  Recent Labs    10/03/20 1215 10/03/20 1528 10/04/20 1620 10/04/20 2154 10/05/20 0525 10/06/20 0118  DDIMER 4.47*  --  5.12*  --   --   --   FERRITIN  --    < >  --  75 77 92  LDH  --   --   --  259*  --   --   CRP  --    < > 18.9* 19.6* 19.0* 14.8*   < > = values in this interval not displayed.    Lab Results  Component Value Date   SARSCOV2NAA POSITIVE (A)  09/27/2020     Exertional dyspnea/lower extremity edema-due to mild acute systolic heart failure-hypoalbuminemia.  UA was negative for proteinuria.  CTA chest/lower extremity Doppler were negative-she has significant volume overload up to her thighs-after a few doses of IV Lasix-her volume status is improved-although she still does have edema below her knees.  She claims that she feels much better today-her exertional dyspnea and orthopnea have completely resolved after intravenous furosemide.  Since she is adamant on being discharged today-I reached out to cardiologist-Dr. Lucius Conn reviewed chart-recommendations we will initiate beta-blocker-continue with diuretics-he will arrange for follow-up at his clinic-and a repeat echocardiogram in the next few weeks.  Patient is aware that she needs a repeat electrolyte check in the next week or so.  Recent C-section: Will be followed closely during this hospital stay-on day of discharge-this MD reached out to Dr. Wilhemina Cash advised that the patient will be seen at the office this coming Friday.  Hypokalemia:  Continue to replete-recheck at PCPs office in 1 week.  Microcytic anemia-ferritin levels on the lower side-suspect this is iron deficiency.  Continue with iron supplementation  History of protein S deficiency: On prophylactic Lovenox-apparently no history of VTE in the past per patient.  Recent Doppler/CTA chest negative.    HTN: Previously on amlodipine that she apparently has never started-stopping amlodipine and switching her to metoprolol.  Morbid Obesity: Estimated body mass index is 36.83 kg/m as calculated from the following:   Height as of this encounter: 5\' 3"  (1.6 m).   Weight as of this encounter: 94.3 kg.    Discharge Diagnoses:  Active Problems:   Protein S deficiency ([place])   COVID-19 virus infection   Hypokalemia   Acute respiratory distress   Elevated troponin   Fluid overload   Discharge Instructions:    Person  Under Monitoring Name: Jennifer Summers  Location: [address] [place] [address]   Infection Prevention Recommendations for Individuals Confirmed to have, or Being Evaluated for, 2019 Novel Coronavirus (COVID-19) Infection Who Receive Care at Home  Individuals who are confirmed to have, or are being evaluated for, COVID-19 should follow the prevention steps below until a healthcare provider or local or state health department says they can return to normal activities.  Stay home except to get medical care You should restrict activities outside your home, except for getting medical care. Do not go to work, school, or public areas, and do not use public transportation or taxis.  Call ahead before visiting your doctor Before your medical appointment, call the healthcare provider and tell them that you have, or are being evaluated for, COVID-19 infection. This will help the healthcare provider's office take steps to keep other people from getting infected. Ask your healthcare provider to call the local or state health department.  Monitor your symptoms Seek prompt medical attention if your illness is worsening (e.g., difficulty breathing). Before going to your medical appointment, call the healthcare provider and tell them that you have, or are being evaluated for, COVID-19 infection. Ask your healthcare provider to call the local or state health department.  Wear a facemask You should wear a facemask that covers  your nose and mouth when you are in the same room with other people and when you visit a healthcare provider. People who live with or visit you should also wear a facemask while they are in the same room with you.  Separate yourself from other people in your home As much as possible, you should stay in a different room from other people in your home. Also, you should use a separate bathroom, if available.  Avoid sharing household items You should not share  dishes, drinking glasses, cups, eating utensils, towels, bedding, or other items with other people in your home. After using these items, you should wash them thoroughly with soap and water.  Cover your coughs and sneezes Cover your mouth and nose with a tissue when you cough or sneeze, or you can cough or sneeze into your sleeve. Throw used tissues in a lined trash can, and immediately wash your hands with soap and water for at least 20 seconds or use an alcohol-based hand rub.  Wash your [facility] your hands often and thoroughly with soap and water for at least 20 seconds. You can use an alcohol-based hand sanitizer if soap and water are not available and if your hands are not visibly dirty. Avoid touching your eyes, nose, and mouth with unwashed hands.   Prevention Steps for Caregivers and Household Members of Individuals Confirmed to have, or Being Evaluated for, COVID-19 Infection Being Cared for in the Home  If you live with, or provide care at home for, a person confirmed to have, or being evaluated for, COVID-19 infection please follow these guidelines to prevent infection:  Follow healthcare provider's instructions Make sure that you understand and can help the patient follow any healthcare provider instructions for all care.  Provide for the patient's basic needs You should help the patient with basic needs in the home and provide support for getting groceries, prescriptions, and other personal needs.  Monitor the patient's symptoms If they are getting sicker, call his or her medical provider and tell them that the patient has, or is being evaluated for, COVID-19 infection. This will help the healthcare provider's office take steps to keep other people from getting infected. Ask the healthcare provider to call the local or state health department.  Limit the number of people who have contact with the patient  If possible, have only one caregiver for the patient.  Other  household members should stay in another home or place of residence. If this is not possible, they should stay  in another room, or be separated from the patient as much as possible. Use a separate bathroom, if available.  Restrict visitors who do not have an essential need to be in the home.  Keep older adults, very young children, and other sick people away from the patient Keep older adults, very young children, and those who have compromised immune systems or chronic health conditions away from the patient. This includes people with chronic heart, lung, or kidney conditions, diabetes, and cancer.  Ensure good ventilation Make sure that shared spaces in the home have good air flow, such as from an air conditioner or an opened window, weather permitting.  Wash your hands often  Wash your hands often and thoroughly with soap and water for at least 20 seconds. You can use an alcohol based hand sanitizer if soap and water are not available and if your hands are not visibly dirty.  Avoid touching your eyes, nose, and mouth with unwashed hands.  Use disposable paper towels to dry your hands. If not available, use dedicated cloth towels and replace them when they become wet.  Wear a facemask and gloves  Wear a disposable facemask at all times in the room and gloves when you touch or have contact with the patient's blood, body fluids, and/or secretions or excretions, such as sweat, saliva, sputum, nasal mucus, vomit, urine, or feces.  Ensure the mask fits over your nose and mouth tightly, and do not touch it during use.  Throw out disposable facemasks and gloves after using them. Do not reuse.  Wash your hands immediately after removing your facemask and gloves.  If your personal clothing becomes contaminated, carefully remove clothing and launder. Wash your hands after handling contaminated clothing.  Place all used disposable facemasks, gloves, and other waste in a lined container before  disposing them with other household waste.  Remove gloves and wash your hands immediately after handling these items.  Do not share dishes, glasses, or other household items with the patient  Avoid sharing household items. You should not share dishes, drinking glasses, cups, eating utensils, towels, bedding, or other items with a patient who is confirmed to have, or being evaluated for, COVID-19 infection.  After the person uses these items, you should wash them thoroughly with soap and water.  Wash laundry thoroughly  Immediately remove and wash clothes or bedding that have blood, body fluids, and/or secretions or excretions, such as sweat, saliva, sputum, nasal mucus, vomit, urine, or feces, on them.  Wear gloves when handling laundry from the patient.  Read and follow directions on labels of laundry or clothing items and detergent. In general, wash and dry with the warmest temperatures recommended on the label.  Clean all areas the individual has used often  Clean all touchable surfaces, such as counters, tabletops, doorknobs, bathroom fixtures, toilets, phones, keyboards, tablets, and bedside tables, every day. Also, clean any surfaces that may have blood, body fluids, and/or secretions or excretions on them.  Wear gloves when cleaning surfaces the patient has come in contact with.  Use a diluted bleach solution (e.g., dilute bleach with 1 part bleach and 10 parts water) or a household disinfectant with a label that says EPA-registered for coronaviruses. To make a bleach solution at home, add 1 tablespoon of bleach to 1 quart (4 cups) of water. For a larger supply, add  cup of bleach to 1 gallon (16 cups) of water.  Read labels of cleaning products and follow recommendations provided on product labels. Labels contain instructions for safe and effective use of the cleaning product including precautions you should take when applying the product, such as wearing gloves or eye protection  and making sure you have good ventilation during use of the product.  Remove gloves and wash hands immediately after cleaning.  Monitor yourself for signs and symptoms of illness Caregivers and household members are considered close contacts, should monitor their health, and will be asked to limit movement outside of the home to the extent possible. Follow the monitoring steps for close contacts listed on the symptom monitoring form.   ? If you have additional questions, contact your local health department or call the epidemiologist on call at [phone] (available 24/7). ? This guidance is subject to change. For the most up-to-date guidance from [facility], please refer to their website: YouBlogs.pl    Activity:  As tolerated  Discharge Instructions    Call MD for:   Complete by: As directed    Temp >  101   Call MD for:  difficulty breathing, headache or visual disturbances   Complete by: As directed    Call MD for:  extreme fatigue   Complete by: As directed    Call MD for:  redness, tenderness, or signs of infection (pain, swelling, redness, odor or green/yellow discharge around incision site)   Complete by: As directed    Diet - low sodium heart healthy   Complete by: As directed    Increase activity slowly   Complete by: As directed    Leave dressing on - Keep it clean, dry, and intact until clinic visit   Complete by: As directed      Allergies as of 10/06/2020      Reactions   Codeine Other (See Comments)   Unknown childhood reaction.      Medication List    STOP taking these medications   amLODipine 5 MG tablet Commonly known as: NORVASC     TAKE these medications   albuterol 108 (90 Base) MCG/ACT inhaler Commonly known as: VENTOLIN HFA Inhale 2 puffs into the lungs every 6 (six) hours as needed for wheezing or shortness of breath.   enoxaparin 40 MG/0.4ML injection Commonly known as:  LOVENOX Inject 0.4 mLs (40 mg total) into the skin daily.   FLUoxetine 20 MG tablet Commonly known as: PROZAC TAKE 1 TABLET BY MOUTH DAILY   furosemide 40 MG tablet Commonly known as: Lasix Take 1 tablet (40 mg total) by mouth daily.   hydrOXYzine 25 MG tablet Commonly known as: ATARAX/VISTARIL Take 25 mg by mouth at bedtime as needed (For sleep.).   ibuprofen 100 MG/5ML suspension Commonly known as: ADVIL Take 30 mLs (600 mg total) by mouth every 6 (six) hours as needed for mild pain or moderate pain.   metoprolol tartrate 25 MG tablet Commonly known as: LOPRESSOR Take 1 tablet (25 mg total) by mouth 2 (two) times daily.   oxyCODONE 5 MG/5ML solution Commonly known as: ROXICODONE Take 5 mLs (5 mg total) by mouth every 4 (four) hours as needed for up to 7 days for moderate pain.   pantoprazole 40 MG tablet Commonly known as: PROTONIX TAKE 1 TABLET BY MOUTH DAILY.   Potassium Chloride ER 20 MEQ Tbcr Take 40 mEq by mouth daily.   prenatal multivitamin Tabs tablet Take 1 tablet by mouth daily at 12 noon.   simethicone 80 MG chewable tablet Commonly known as: MYLICON Chew 80 mg by mouth every 6 (six) hours as needed for flatulence.   Slow Iron 160 (50 Fe) MG Tbcr SR tablet Generic drug: ferrous sulfate Take 160 mg by mouth daily.            Discharge Care Instructions  (From admission, onward)         Start     Ordered   10/06/20 0000  Leave dressing on - Keep it clean, dry, and intact until clinic visit        10/06/20 1153          Follow-up Information    Tolia, Sunit, DO Follow up on 10/20/2020.   Specialties: Cardiology, Radiology, Vascular Surgery Why: 11am.  Bring your medications bottles with you.  Call the office 4 days before appointment for lab orders.  Contact information: [place] [address] [phone]        Midge Minium, MD. Schedule an appointment as soon as possible for a visit in 1 week(s).    Specialty:  Family Medicine Contact information: [address] [place] [address] [phone]        Louretta Shorten, MD. Schedule an appointment as soon as possible for a visit on 10/08/2020.   Specialty: Obstetrics and Gynecology Contact information: [address], [address] [place] [address] [phone]              Allergies  Allergen Reactions  . Codeine Other (See Comments)    Unknown childhood reaction.     Other Procedures/Studies: CT Angio Chest PE W and/or Wo Contrast  Result Date: 10/03/2020 CLINICAL DATA:  Shortness of breath and bilateral lower extremity swelling for 2 days. Elevated D-dimer. EXAM: CT ANGIOGRAPHY CHEST WITH CONTRAST TECHNIQUE: Multidetector CT imaging of the chest was performed using the standard protocol during bolus administration of intravenous contrast. Multiplanar CT image reconstructions and MIPs were obtained to evaluate the vascular anatomy. CONTRAST:  80 mL OMNIPAQUE IOHEXOL 350 MG/ML SOLN COMPARISON:  Single-view of the chest today. FINDINGS: Cardiovascular: No pulmonary embolus is identified. No aneurysm. Heart size is upper normal. No pericardial effusion. Mediastinum/Nodes: No enlarged mediastinal, hilar, or axillary lymph nodes. Thyroid gland, trachea, and esophagus demonstrate no significant findings. Lungs/Pleura: No pleural effusion. There is mild dependent atelectasis. Lungs otherwise clear. Upper Abdomen: Negative. Musculoskeletal: Negative. Review of the MIP images confirms the above findings. IMPRESSION: Negative for pulmonary embolus.  Negative chest CT. Electronically Signed   By: Inge Rise M.D.   On: 10/03/2020 13:15   DG Chest Port 1 View  Result Date: 10/06/2020 CLINICAL DATA:  Shortness of breath.  COVID-19 positive EXAM: PORTABLE CHEST 1 VIEW COMPARISON:  October 04, 2020 FINDINGS: Lungs are clear. There is stable cardiac prominence. Pulmonary vascularity is within normal limits. No adenopathy. No  bone lesions. IMPRESSION: Lungs clear.  Stable cardiac prominence.  No adenopathy evident. Electronically Signed   By: Lowella Grip III M.D.   On: 10/06/2020 08:47   DG CHEST PORT 1 VIEW  Result Date: 10/04/2020 CLINICAL DATA:  Shortness of breath COVID September 27, 2020 EXAM: PORTABLE CHEST 1 VIEW COMPARISON:  Chest radiograph October 03, 2020 and CT a chest October 03, 2020 FINDINGS: The heart size and mediastinal contours are unchanged. Right basilar platelike atelectasis. No focal consolidation. No pleural effusion. No visible pneumothorax. The visualized skeletal structures are unchanged. IMPRESSION: Right basilar platelike atelectasis, no new focal consolidation. The. Electronically Signed   By: Dahlia Bailiff MD   On: 10/04/2020 16:14   DG Chest Portable 1 View  Result Date: 10/03/2020 CLINICAL DATA:  Shortness of breath and leg swelling. EXAM: PORTABLE CHEST 1 VIEW COMPARISON:  None. FINDINGS: The cardiomediastinal silhouette is unremarkable. Mild pulmonary vascular congestion is noted. Opacities/atelectasis in the MEDIAL RIGHT LOWER lung noted. No pleural effusion or pneumothorax noted. No acute bony abnormalities are present. IMPRESSION: RIGHT LOWER lung opacities versus atelectasis. This may represent infection/pneumonia. Mild pulmonary vascular congestion. Electronically Signed   By: Margarette Canada M.D.   On: 10/03/2020 11:49   ECHOCARDIOGRAM COMPLETE  Result Date: 10/05/2020    ECHOCARDIOGRAM REPORT   Patient Name:   Jennifer Summers Date of Exam: 10/05/2020 Medical Rec #:  [id number]       Height:       63.0 in Accession #:    [id number]      Weight:       207.9 lb Date of Birth:  11/05/1980      BSA:          1.966 m  Patient Age:    40 years        BP:           136/72 mmHg Patient Gender: F               HR:           81 bpm. Exam Location:  Inpatient Procedure: 2D Echo, 3D Echo, Color Doppler, Cardiac Doppler and Strain Analysis Indications:    Congestive Heart Failure I50.9  History:         Patient has no prior history of Echocardiogram examinations.                 Signs/Symptoms:Shortness of Breath; Risk Factors:Prediabetes. 7                 days postpartum.  Sonographer:    Darlina Sicilian RDCS Referring Phys: [place]  1. Left ventricular ejection fraction, by estimation, is 50 to 55%. The left ventricle has low normal function. The left ventricle has no regional wall motion abnormalities. Left ventricular diastolic parameters were normal. The average left ventricular  global longitudinal strain is -19.1 %. The global longitudinal strain is normal.  2. Right ventricular systolic function is normal. The right ventricular size is normal. There is mildly elevated pulmonary artery systolic pressure.  3. The mitral valve is normal in structure. Trivial mitral valve regurgitation. No evidence of mitral stenosis.  4. Tricuspid valve regurgitation is moderate.  5. The aortic valve is tricuspid. Aortic valve regurgitation is not visualized. No aortic stenosis is present.  6. The inferior vena cava is normal in size with <50% respiratory variability, suggesting right atrial pressure of 8 mmHg. Comparison(s): No prior Echocardiogram. Conclusion(s)/Recommendation(s): Otherwise normal echocardiogram, with minor abnormalities described in the report. TR appears moderate in several views. Normal LV strain, low normal EF without focal wall motion abnormalities. FINDINGS  Left Ventricle: Left ventricular ejection fraction, by estimation, is 50 to 55%. The left ventricle has low normal function. The left ventricle has no regional wall motion abnormalities. The average left ventricular global longitudinal strain is -19.1 %. The global longitudinal strain is normal. The left ventricular internal cavity size was normal in size. There is no left ventricular hypertrophy. Left ventricular diastolic parameters were normal. Right Ventricle: The right ventricular size is normal. No increase  in right ventricular wall thickness. Right ventricular systolic function is normal. There is mildly elevated pulmonary artery systolic pressure. The tricuspid regurgitant velocity is 2.65  m/s, and with an assumed right atrial pressure of 8 mmHg, the estimated right ventricular systolic pressure is 86.7 mmHg. Left Atrium: Left atrial size was normal in size. Right Atrium: Right atrial size was normal in size. Pericardium: There is no evidence of pericardial effusion. Mitral Valve: The mitral valve is normal in structure. Trivial mitral valve regurgitation. No evidence of mitral valve stenosis. Tricuspid Valve: The tricuspid valve is normal in structure. Tricuspid valve regurgitation is moderate . No evidence of tricuspid stenosis. Aortic Valve: The aortic valve is tricuspid. Aortic valve regurgitation is not visualized. No aortic stenosis is present. Pulmonic Valve: The pulmonic valve was grossly normal. Pulmonic valve regurgitation is trivial. No evidence of pulmonic stenosis. Aorta: The aortic root, ascending aorta, aortic arch and descending aorta are all structurally normal, with no evidence of dilitation or obstruction. Venous: The inferior vena cava is normal in size with less than 50% respiratory variability, suggesting right atrial pressure of 8 mmHg. IAS/Shunts: The atrial septum is grossly normal.  LEFT VENTRICLE PLAX 2D LVIDd:         5.00 cm  Diastology LVIDs:         3.90 cm  LV e' medial:    10.40 cm/s LV PW:         0.70 cm  LV E/e' medial:  10.7 LV IVS:        0.90 cm  LV e' lateral:   9.03 cm/s LVOT diam:     1.70 cm  LV E/e' lateral: 12.3 LV SV:         62 LV SV Index:   32       2D Longitudinal Strain LVOT Area:     2.27 cm 2D Strain GLS (A2C):   -19.7 %                         2D Strain GLS (A3C):   -20.0 %                         2D Strain GLS (A4C):   -17.5 %                         2D Strain GLS Avg:     -19.1 % RIGHT VENTRICLE RV S prime:     13.60 cm/s TAPSE (M-mode): 2.1 cm LEFT ATRIUM              Index       RIGHT ATRIUM           Index LA diam:        3.90 cm 1.98 cm/m  RA Area:     11.60 cm LA Vol (A2C):   53.0 ml 26.96 ml/m RA Volume:   21.80 ml  11.09 ml/m LA Vol (A4C):   24.5 ml 12.46 ml/m LA Biplane Vol: 36.1 ml 18.36 ml/m  AORTIC VALVE LVOT Vmax:   145.00 cm/s LVOT Vmean:  91.800 cm/s LVOT VTI:    0.274 m  AORTA Ao Root diam: 2.80 cm Ao Asc diam:  2.80 cm MITRAL VALVE                TRICUSPID VALVE MV Area (PHT): 4.06 cm     TR Peak grad:   28.1 mmHg MV Decel Time: 187 msec     TR Vmax:        265.00 cm/s MV E velocity: 111.00 cm/s MV A velocity: 77.10 cm/s   SHUNTS MV E/A ratio:  1.44         Systemic VTI:  0.27 m                             Systemic Diam: 1.70 cm Buford Dresser MD Electronically signed by Buford Dresser MD Signature Date/Time: 10/05/2020/11:17:02 AM    Final    VAS [place] LOWER EXTREMITY VENOUS (DVT)  Result Date: 10/04/2020  Lower Venous DVT Study Indications: Swelling, SOB, and Covid-19, history of Protein S deficiency.  Risk Factors: Surgery C-Section 09/28/20. Limitations: Edema, pain from staples. Comparison Study: No prior study Performing Technologist: Sharion Dove RVS  Examination Guidelines: A complete evaluation includes B-mode imaging, spectral Doppler, color Doppler, and power Doppler as needed of all accessible portions of each vessel. Bilateral testing is considered an integral part of a complete examination. Limited examinations for reoccurring indications may be performed as noted. The reflux portion  of the exam is performed with the patient in reverse Trendelenburg.  +---------+---------------+---------+-----------+----------+--------------+ RIGHT    CompressibilityPhasicitySpontaneityPropertiesThrombus Aging +---------+---------------+---------+-----------+----------+--------------+ CFV      Full                                         pulsatile flow  +---------+---------------+---------+-----------+----------+--------------+ SFJ      Full                                                        +---------+---------------+---------+-----------+----------+--------------+ FV Prox  Full                                                        +---------+---------------+---------+-----------+----------+--------------+ FV Mid   Full                                                        +---------+---------------+---------+-----------+----------+--------------+ FV DistalFull                                                        +---------+---------------+---------+-----------+----------+--------------+ PFV      Full                                                        +---------+---------------+---------+-----------+----------+--------------+ POP      Full                                         pulsatile flow +---------+---------------+---------+-----------+----------+--------------+ PTV      Full                                                        +---------+---------------+---------+-----------+----------+--------------+ PERO     Full                                                        +---------+---------------+---------+-----------+----------+--------------+   +---------+---------------+---------+-----------+----------+--------------+ LEFT     CompressibilityPhasicitySpontaneityPropertiesThrombus Aging +---------+---------------+---------+-----------+----------+--------------+ CFV      Full           Yes      Yes  pulsatile flow +---------+---------------+---------+-----------+----------+--------------+ SFJ      Full                                                        +---------+---------------+---------+-----------+----------+--------------+ FV Prox  Full                                                         +---------+---------------+---------+-----------+----------+--------------+ FV Mid   Full                                                        +---------+---------------+---------+-----------+----------+--------------+ FV DistalFull                                                        +---------+---------------+---------+-----------+----------+--------------+ PFV      Full                                                        +---------+---------------+---------+-----------+----------+--------------+ POP      Full                                         pulsatile flow +---------+---------------+---------+-----------+----------+--------------+ PTV      Full                                                        +---------+---------------+---------+-----------+----------+--------------+ PERO     Full                                                        +---------+---------------+---------+-----------+----------+--------------+     Summary: BILATERAL: - No evidence of deep vein thrombosis seen in the lower extremities, bilaterally. - RIGHT: pulsatile flow suggestive of fluid overload  LEFT: Pulsatile flow suggestive of fluid overload.  *See table(s) above for measurements and observations. Electronically signed by Ruta Hinds MD on 10/04/2020 at 5:56:48 PM.    Final      TODAY-DAY OF DISCHARGE:  Subjective:   Jennifer Summers today has no headache,no chest abdominal pain,no new weakness tingling or numbness, feels much better wants to go home today.  She is leaving the hospital [facility].  Objective:   Blood pressure (!) 142/89, pulse 65, temperature 98 F (36.7 C),  temperature source Oral, resp. rate 18, height 5\' 3"  (1.6 m), weight 86.9 kg, SpO2 98 %, unknown if currently breastfeeding.  Intake/Output Summary (Last 24 hours) at 10/06/2020 1154 Last data filed at 10/06/2020 3875 Gross per 24 hour  Intake 103.33 ml  Output 2400 ml  Net  -2296.67 ml   Filed Weights   10/04/20 2207 10/06/20 0734  Weight: 94.3 kg 86.9 kg    Exam: Awake Alert, Oriented *3, No new F.N deficits, Normal affect [place].AT,PERRAL Supple Neck,No JVD, No cervical lymphadenopathy appriciated.  Symmetrical Chest wall movement, Good air movement bilaterally, CTAB RRR,No Gallops,Rubs or new Murmurs, No Parasternal Heave +ve B.Sounds, Abd Soft, Non tender, No organomegaly appriciated, No rebound -guarding or rigidity. No Cyanosis, Clubbing or edema, No new Rash or bruise   PERTINENT RADIOLOGIC STUDIES: CT Angio Chest PE W and/or Wo Contrast  Result Date: 10/03/2020 CLINICAL DATA:  Shortness of breath and bilateral lower extremity swelling for 2 days. Elevated D-dimer. EXAM: CT ANGIOGRAPHY CHEST WITH CONTRAST TECHNIQUE: Multidetector CT imaging of the chest was performed using the standard protocol during bolus administration of intravenous contrast. Multiplanar CT image reconstructions and MIPs were obtained to evaluate the vascular anatomy. CONTRAST:  80 mL OMNIPAQUE IOHEXOL 350 MG/ML SOLN COMPARISON:  Single-view of the chest today. FINDINGS: Cardiovascular: No pulmonary embolus is identified. No aneurysm. Heart size is upper normal. No pericardial effusion. Mediastinum/Nodes: No enlarged mediastinal, hilar, or axillary lymph nodes. Thyroid gland, trachea, and esophagus demonstrate no significant findings. Lungs/Pleura: No pleural effusion. There is mild dependent atelectasis. Lungs otherwise clear. Upper Abdomen: Negative. Musculoskeletal: Negative. Review of the MIP images confirms the above findings. IMPRESSION: Negative for pulmonary embolus.  Negative chest CT. Electronically Signed   By: Inge Rise M.D.   On: 10/03/2020 13:15   DG Chest Port 1 View  Result Date: 10/06/2020 CLINICAL DATA:  Shortness of breath.  COVID-19 positive EXAM: PORTABLE CHEST 1 VIEW COMPARISON:  October 04, 2020 FINDINGS: Lungs are clear. There is stable cardiac prominence.  Pulmonary vascularity is within normal limits. No adenopathy. No bone lesions. IMPRESSION: Lungs clear.  Stable cardiac prominence.  No adenopathy evident. Electronically Signed   By: Lowella Grip III M.D.   On: 10/06/2020 08:47   DG CHEST PORT 1 VIEW  Result Date: 10/04/2020 CLINICAL DATA:  Shortness of breath COVID September 27, 2020 EXAM: PORTABLE CHEST 1 VIEW COMPARISON:  Chest radiograph October 03, 2020 and CT a chest October 03, 2020 FINDINGS: The heart size and mediastinal contours are unchanged. Right basilar platelike atelectasis. No focal consolidation. No pleural effusion. No visible pneumothorax. The visualized skeletal structures are unchanged. IMPRESSION: Right basilar platelike atelectasis, no new focal consolidation. The. Electronically Signed   By: Dahlia Bailiff MD   On: 10/04/2020 16:14   DG Chest Portable 1 View  Result Date: 10/03/2020 CLINICAL DATA:  Shortness of breath and leg swelling. EXAM: PORTABLE CHEST 1 VIEW COMPARISON:  None. FINDINGS: The cardiomediastinal silhouette is unremarkable. Mild pulmonary vascular congestion is noted. Opacities/atelectasis in the MEDIAL RIGHT LOWER lung noted. No pleural effusion or pneumothorax noted. No acute bony abnormalities are present. IMPRESSION: RIGHT LOWER lung opacities versus atelectasis. This may represent infection/pneumonia. Mild pulmonary vascular congestion. Electronically Signed   By: Margarette Canada M.D.   On: 10/03/2020 11:49   ECHOCARDIOGRAM COMPLETE  Result Date: 10/05/2020    ECHOCARDIOGRAM REPORT   Patient Name:   Jennifer Summers Date of Exam: 10/05/2020 Medical Rec #:  [id number]       Height:  63.0 in Accession #:    [id number]      Weight:       207.9 lb Date of Birth:  1981-04-29      BSA:          1.966 m Patient Age:    72 years        BP:           136/72 mmHg Patient Gender: F               HR:           81 bpm. Exam Location:  Inpatient Procedure: 2D Echo, 3D Echo, Color Doppler, Cardiac Doppler and Strain  Analysis Indications:    Congestive Heart Failure I50.9  History:        Patient has no prior history of Echocardiogram examinations.                 Signs/Symptoms:Shortness of Breath; Risk Factors:Prediabetes. 7                 days postpartum.  Sonographer:    Darlina Sicilian RDCS Referring Phys: [place]  1. Left ventricular ejection fraction, by estimation, is 50 to 55%. The left ventricle has low normal function. The left ventricle has no regional wall motion abnormalities. Left ventricular diastolic parameters were normal. The average left ventricular  global longitudinal strain is -19.1 %. The global longitudinal strain is normal.  2. Right ventricular systolic function is normal. The right ventricular size is normal. There is mildly elevated pulmonary artery systolic pressure.  3. The mitral valve is normal in structure. Trivial mitral valve regurgitation. No evidence of mitral stenosis.  4. Tricuspid valve regurgitation is moderate.  5. The aortic valve is tricuspid. Aortic valve regurgitation is not visualized. No aortic stenosis is present.  6. The inferior vena cava is normal in size with <50% respiratory variability, suggesting right atrial pressure of 8 mmHg. Comparison(s): No prior Echocardiogram. Conclusion(s)/Recommendation(s): Otherwise normal echocardiogram, with minor abnormalities described in the report. TR appears moderate in several views. Normal LV strain, low normal EF without focal wall motion abnormalities. FINDINGS  Left Ventricle: Left ventricular ejection fraction, by estimation, is 50 to 55%. The left ventricle has low normal function. The left ventricle has no regional wall motion abnormalities. The average left ventricular global longitudinal strain is -19.1 %. The global longitudinal strain is normal. The left ventricular internal cavity size was normal in size. There is no left ventricular hypertrophy. Left ventricular diastolic parameters were normal.  Right Ventricle: The right ventricular size is normal. No increase in right ventricular wall thickness. Right ventricular systolic function is normal. There is mildly elevated pulmonary artery systolic pressure. The tricuspid regurgitant velocity is 2.65  m/s, and with an assumed right atrial pressure of 8 mmHg, the estimated right ventricular systolic pressure is 25.9 mmHg. Left Atrium: Left atrial size was normal in size. Right Atrium: Right atrial size was normal in size. Pericardium: There is no evidence of pericardial effusion. Mitral Valve: The mitral valve is normal in structure. Trivial mitral valve regurgitation. No evidence of mitral valve stenosis. Tricuspid Valve: The tricuspid valve is normal in structure. Tricuspid valve regurgitation is moderate . No evidence of tricuspid stenosis. Aortic Valve: The aortic valve is tricuspid. Aortic valve regurgitation is not visualized. No aortic stenosis is present. Pulmonic Valve: The pulmonic valve was grossly normal. Pulmonic valve regurgitation is trivial. No evidence of pulmonic stenosis. Aorta: The aortic root, ascending aorta, aortic arch  and descending aorta are all structurally normal, with no evidence of dilitation or obstruction. Venous: The inferior vena cava is normal in size with less than 50% respiratory variability, suggesting right atrial pressure of 8 mmHg. IAS/Shunts: The atrial septum is grossly normal.  LEFT VENTRICLE PLAX 2D LVIDd:         5.00 cm  Diastology LVIDs:         3.90 cm  LV e' medial:    10.40 cm/s LV PW:         0.70 cm  LV E/e' medial:  10.7 LV IVS:        0.90 cm  LV e' lateral:   9.03 cm/s LVOT diam:     1.70 cm  LV E/e' lateral: 12.3 LV SV:         62 LV SV Index:   32       2D Longitudinal Strain LVOT Area:     2.27 cm 2D Strain GLS (A2C):   -19.7 %                         2D Strain GLS (A3C):   -20.0 %                         2D Strain GLS (A4C):   -17.5 %                         2D Strain GLS Avg:     -19.1 % RIGHT  VENTRICLE RV S prime:     13.60 cm/s TAPSE (M-mode): 2.1 cm LEFT ATRIUM             Index       RIGHT ATRIUM           Index LA diam:        3.90 cm 1.98 cm/m  RA Area:     11.60 cm LA Vol (A2C):   53.0 ml 26.96 ml/m RA Volume:   21.80 ml  11.09 ml/m LA Vol (A4C):   24.5 ml 12.46 ml/m LA Biplane Vol: 36.1 ml 18.36 ml/m  AORTIC VALVE LVOT Vmax:   145.00 cm/s LVOT Vmean:  91.800 cm/s LVOT VTI:    0.274 m  AORTA Ao Root diam: 2.80 cm Ao Asc diam:  2.80 cm MITRAL VALVE                TRICUSPID VALVE MV Area (PHT): 4.06 cm     TR Peak grad:   28.1 mmHg MV Decel Time: 187 msec     TR Vmax:        265.00 cm/s MV E velocity: 111.00 cm/s MV A velocity: 77.10 cm/s   SHUNTS MV E/A ratio:  1.44         Systemic VTI:  0.27 m                             Systemic Diam: 1.70 cm Buford Dresser MD Electronically signed by Buford Dresser MD Signature Date/Time: 10/05/2020/11:17:02 AM    Final    VAS [place] LOWER EXTREMITY VENOUS (DVT)  Result Date: 10/04/2020  Lower Venous DVT Study Indications: Swelling, SOB, and Covid-19, history of Protein S deficiency.  Risk Factors: Surgery C-Section 09/28/20. Limitations: Edema, pain from staples. Comparison Study: No prior study Performing Technologist: Sharion Dove RVS  Examination Guidelines: A complete  evaluation includes B-mode imaging, spectral Doppler, color Doppler, and power Doppler as needed of all accessible portions of each vessel. Bilateral testing is considered an integral part of a complete examination. Limited examinations for reoccurring indications may be performed as noted. The reflux portion of the exam is performed with the patient in reverse Trendelenburg.  +---------+---------------+---------+-----------+----------+--------------+ RIGHT    CompressibilityPhasicitySpontaneityPropertiesThrombus Aging +---------+---------------+---------+-----------+----------+--------------+ CFV      Full                                         pulsatile  flow +---------+---------------+---------+-----------+----------+--------------+ SFJ      Full                                                        +---------+---------------+---------+-----------+----------+--------------+ FV Prox  Full                                                        +---------+---------------+---------+-----------+----------+--------------+ FV Mid   Full                                                        +---------+---------------+---------+-----------+----------+--------------+ FV DistalFull                                                        +---------+---------------+---------+-----------+----------+--------------+ PFV      Full                                                        +---------+---------------+---------+-----------+----------+--------------+ POP      Full                                         pulsatile flow +---------+---------------+---------+-----------+----------+--------------+ PTV      Full                                                        +---------+---------------+---------+-----------+----------+--------------+ PERO     Full                                                        +---------+---------------+---------+-----------+----------+--------------+   +---------+---------------+---------+-----------+----------+--------------+ LEFT  CompressibilityPhasicitySpontaneityPropertiesThrombus Aging +---------+---------------+---------+-----------+----------+--------------+ CFV      Full           Yes      Yes                  pulsatile flow +---------+---------------+---------+-----------+----------+--------------+ SFJ      Full                                                        +---------+---------------+---------+-----------+----------+--------------+ FV Prox  Full                                                         +---------+---------------+---------+-----------+----------+--------------+ FV Mid   Full                                                        +---------+---------------+---------+-----------+----------+--------------+ FV DistalFull                                                        +---------+---------------+---------+-----------+----------+--------------+ PFV      Full                                                        +---------+---------------+---------+-----------+----------+--------------+ POP      Full                                         pulsatile flow +---------+---------------+---------+-----------+----------+--------------+ PTV      Full                                                        +---------+---------------+---------+-----------+----------+--------------+ PERO     Full                                                        +---------+---------------+---------+-----------+----------+--------------+     Summary: BILATERAL: - No evidence of deep vein thrombosis seen in the lower extremities, bilaterally. - RIGHT: pulsatile flow suggestive of fluid overload  LEFT: Pulsatile flow suggestive of fluid overload.  *See table(s) above for measurements and observations. Electronically signed by Ruta Hinds MD on 10/04/2020 at 5:56:48 PM.    Final      PERTINENT LAB RESULTS: CBC: Recent Labs  10/05/20 0525 10/06/20 0118  WBC 14.2* 11.1*  HGB 9.1* 10.1*  HCT 28.3* 32.9*  PLT 373 381   CMET CMP     Component Value Date/Time   NA 142 10/06/2020 0118   K 3.1 (L) 10/06/2020 0118   CL 105 10/06/2020 0118   CO2 24 10/06/2020 0118   GLUCOSE 87 10/06/2020 0118   BUN 5 (L) 10/06/2020 0118   BUN 6 08/23/2018 0000   CREATININE 0.76 10/06/2020 0118   CALCIUM 8.3 (L) 10/06/2020 0118   PROT 5.7 (L) 10/06/2020 0118   ALBUMIN 2.3 (L) 10/06/2020 0118   AST 13 (L) 10/06/2020 0118   ALT 13 10/06/2020 0118   ALKPHOS 107 10/06/2020 0118    BILITOT 0.6 10/06/2020 0118   GFRNONAA >60 10/06/2020 0118   GFRAA >60 04/03/2017 2219    GFR Estimated Creatinine Clearance: 98.7 mL/min (by C-G formula based on SCr of 0.76 mg/dL). No results for input(s): LIPASE, AMYLASE in the last 72 hours. No results for input(s): CKTOTAL, CKMB, CKMBINDEX, TROPONINI in the last 72 hours. Invalid input(s): [place]    10/03/20 1215 10/04/20 1620  DDIMER 4.47* 5.12*   No results for input(s): HGBA1C in the last 72 hours. No results for input(s): CHOL, HDL, LDLCALC, TRIG, CHOLHDL, LDLDIRECT in the last 72 hours. Recent Labs    10/03/20 1528  TSH 0.982   Recent Labs    10/05/20 0525 10/06/20 0118  FERRITIN 77 92   Coags: No results for input(s): INR in the last 72 hours.  Invalid input(s): PT Microbiology: Recent Results (from the past 240 hour(s))  SARS CORONAVIRUS 2 (TAT 6-24 HRS) Nasopharyngeal Nasopharyngeal Swab     Status: Abnormal   Collection Time: 09/27/20 10:10 AM   Specimen: Nasopharyngeal Swab  Result Value Ref Range Status   SARS Coronavirus 2 POSITIVE (A) NEGATIVE Final    Comment: (NOTE) SARS-CoV-2 target nucleic acids are DETECTED.  The SARS-CoV-2 RNA is generally detectable in upper and lower respiratory specimens during the acute phase of infection. Positive results are indicative of the presence of SARS-CoV-2 RNA. Clinical correlation with patient history and other diagnostic information is  necessary to determine patient infection status. Positive results do not rule out bacterial infection or co-infection with other viruses.  The expected result is Negative.  Fact Sheet for Patients: SugarRoll.be  Fact Sheet for Healthcare Providers: https://www.woods-mathews.com/  This test is not yet approved or cleared by the [place] FDA and  has been authorized for detection and/or diagnosis of SARS-CoV-2 by FDA under an Emergency Use Authorization (EUA).  This EUA will remain  in effect (meaning this test can be used) for the duration of the COVID-19 declaration under Section 564(b)(1) of the Act, 21 U. S.C. section 360bbb-3(b)(1), unless the authorization is terminated or revoked sooner.   Performed at [facility], [place] [address], [place], [place] [address]   Culture, blood (Routine X 2) w Reflex to ID Panel     Status: None (Preliminary result)   Collection Time: 10/03/20  2:12 PM   Specimen: BLOOD  Result Value Ref Range Status   Specimen Description BLOOD SITE NOT SPECIFIED  Final   Special Requests   Final    BOTTLES DRAWN AEROBIC AND ANAEROBIC Blood Culture results may not be optimal due to an inadequate volume of blood received in culture bottles   Culture   Final    NO GROWTH 3 DAYS Performed at [facility], [place],  [place] [address]    Report Status PENDING  Incomplete  Culture, blood (routine x 2)     Status: None (Preliminary result)   Collection Time: 10/05/20  8:00 AM   Specimen: BLOOD LEFT HAND  Result Value Ref Range Status   Specimen Description BLOOD LEFT HAND  Final   Special Requests   Final    BOTTLES DRAWN AEROBIC AND ANAEROBIC Blood Culture adequate volume   Culture   Final    NO GROWTH < 24 HOURS Performed at [facility], [place] [address] [place], [place] [address]    Report Status PENDING  Incomplete  Culture, blood (routine x 2)     Status: None (Preliminary result)   Collection Time: 10/05/20  8:00 AM   Specimen: BLOOD RIGHT HAND  Result Value Ref Range Status   Specimen Description BLOOD RIGHT HAND  Final   Special Requests   Final    BOTTLES DRAWN AEROBIC AND ANAEROBIC Blood Culture results may not be optimal due to an inadequate volume of blood received in culture bottles   Culture   Final    NO GROWTH < 24 HOURS Performed at [facility], [place] [address] [place], [place] [address]    Report Status PENDING  Incomplete    FURTHER DISCHARGE  INSTRUCTIONS:  Get Medicines reviewed and adjusted: Please take all your medications with you for your next visit with your Primary MD  Laboratory/radiological data: Please request your Primary MD to go over all hospital tests and procedure/radiological results at the follow up, please ask your Primary MD to get all Hospital records sent to his/her office.  In some cases, they will be blood work, cultures and biopsy results pending at the time of your discharge. Please request that your primary care M.D. goes through all the records of your hospital data and follows up on these results.  Also Note the following: If you experience worsening of your admission symptoms, develop shortness of breath, life threatening emergency, suicidal or homicidal thoughts you must seek medical attention immediately by calling 911 or calling your MD immediately  if symptoms less severe.  You must read complete instructions/literature along with all the possible adverse reactions/side effects for all the Medicines you take and that have been prescribed to you. Take any new Medicines after you have completely understood and accpet all the possible adverse reactions/side effects.   Do not drive when taking Pain medications or sleeping medications (Benzodaizepines)  Do not take more than prescribed Pain, Sleep and Anxiety Medications. It is not advisable to combine anxiety,sleep and pain medications without talking with your primary care practitioner  Special Instructions: If you have smoked or chewed Tobacco  in the last 2 yrs please stop smoking, stop any regular Alcohol  and or any Recreational drug use.  Wear Seat belts while driving.  Please note: You were cared for by a hospitalist during your hospital stay. Once you are discharged, your primary care physician will handle any further medical issues. Please note that NO REFILLS for any discharge medications will be authorized once you are discharged, as it is  imperative that you return to your primary care physician (or establish a relationship with a primary care physician if you do not have one) for your post hospital discharge needs so that they can reassess your need for medications and monitor your lab values.  Total Time spent coordinating discharge including counseling, education and face to face time equals 35 minutes.  Signed: Dispensing optician  Ghimire 10/06/2020 11:54 AM

## 2020-10-06 NOTE — Progress Notes (Signed)
Seen and examined a few minutes ago.  She feels much better-claims that her shortness of breath has essentially resolved.  Her lower extremity edema is now limited to her legs-no longer in her thighs.  She had fever yesterday afternoon.  She is asking to leave the hospital at all cost today!-I explained the rationale for her to stay for for another day or so so that we could monitor her-make sure that the fever is not coming from other causes apart from Covid infection.  We could also get inpatient cardiology evaluation.  She is aware of the life-threatening and life disabling risks of leaving the hospital [place].    However in spite of my extensive counseling-she wants to leave [place].  She is agreeable to stay to get 1 1 more dose of IV Lasix and her fourth dose of Remdesivir today.  I will get her a appointment at the [place] [facility]-unfortunately it is close tomorrow-but will open on Friday.  I have spoken with cardiology-Dr. Lucius Conn has reviewed chart-and is agreeable with discharging her on Lasix/metoprolol-he will arrange for follow-up in repeat outpatient echocardiogram at his office in a few weeks.  I will alert the OB team regarding the patient's decision today to see if they have any other recommendations.  Note-patient is not breast-feeding-and does not intend to breast-feed.  She will be discharged/AMA at her own request later today.

## 2020-10-06 NOTE — Progress Notes (Signed)
Patient discharged home.  Discharge instructions explained, patient verbalizes understanding.  Medications from [place] given to patient.

## 2020-10-06 NOTE — Consult Note (Signed)
   [facility] Inpatient Consult   10/06/2020  Kebra Lowrimore 06-25-1981 [id number]    [facility] [ACO] Patient:  [place]  Patient is currently assigned to a Telephonic [facility] RN Care Coordinator for the [place] for post hospital follow up and support.  Our community based plan of care has focused on disease management and community resource support.    Patient will receive a post hospital call and will be evaluated for assessments and disease process education.     Plan: Patient will be followed by [place] Coordinator.   For additional questions or referrals please contact:   Natividad Brood, RN BSN [facility] Liaison  [phone] business mobile phone Toll free office [phone]  Fax number: [phone] [email or web address]

## 2020-10-07 ENCOUNTER — Other Ambulatory Visit: Payer: Self-pay | Admitting: *Deleted

## 2020-10-07 ENCOUNTER — Telehealth: Payer: Self-pay | Admitting: Family Medicine

## 2020-10-07 NOTE — Telephone Encounter (Signed)
Patient called and stated that the hospital put her on potassium but they are too big. Would like to switch to little packets that mix in water. Please advise

## 2020-10-07 NOTE — Telephone Encounter (Signed)
Patient said that hospital put her on potassium, but the pills are two big - can you switch to little packets that mix in water.

## 2020-10-07 NOTE — Patient Outreach (Signed)
[facility]) Care Management  10/07/2020  Jennifer Summers 02-04-1981 [id number]   Transition of care call/case closure   Referral received: 10/06/20 Initial outreach:10/07/20 Insurance: [place] [employer]    Subjective: Initial successful telephone call to patient's preferred number in order to complete transition of care assessment; 2 HIPAA identifiers verified. Explained purpose of call and completed transition of care assessment.  Jennifer Summers states that she is doing a lot better. She reports being able lay flat now, states oxygen saturation staying in the 94% range even after up walking denies having reading less than 90% or increase in shortness of breath or fever. She reports still having some swelling in lower extremities not as bad. Discussed discharge medications of lasix and potassium she discussed having some difficulty with swallowing potassium in the hospital and it was changed to powder dissolved in liquid. She understands not to crush tablets, discussed allowing to dissolve such as  in applesauce. Discussed rationale of importance of taking potassium along with prescribed lasix. Discussed importance of PCP follow up, patient agreeable to placing call to schedule post discharge visit states she will also discuss a different form of potassium.    She denies post-operative problems, says surgical incisions are unremarkable, states surgical pain well managed with prescribed medications. She reports having OBGYN appointmnt on 2/28 for staple removal .She reports not having much of a appetite taking in fluids well, discusses small for frequent meals.  She denies bowel or bladder problems.  Spouse is  assisting with her recovery. She states that her baby girl is doing well.   Reviewed accessing the following [place] Benefits : Discussed benefit at [facility] with cost saving benefit in cost of blood pressure monitor $10, no prescription needed. Patient agreeable  to return call to verify monitor in stock.  She states she has completed FMLA .  She  uses a [facility] outpatient pharmacy at [facility] .    She denies educational needs related to staying safe during the COVID 19 pandemic.    Objective:  Jennifer Summers was hospitalized at [facility]  from 2/21-2/23/22 for Acute respiratory distress, SIRS,  fluid overload,Lowe extremity edema, covid 19 infection, elevated troponin. PMHx:  Positive Covid 19 on 2/14, Hospital admission 2/15-2/17/21 s/p C-section delivery of baby girl.   She was discharged to home on 10/06/20  without the need for home health services or DME.   Assessment:  Patient voices good understanding of all discharge instructions.  See transition of care flowsheet for assessment details.   Plan:  Reviewed hospital discharge diagnosis of SIRS, Acute Respiratory Distress , Fluid overload,lower extremity edema  and discharge treatment plan using hospital discharge instructions, assessing medication adherence, reviewing problems requiring provider notification, and discussing the importance of follow up with surgeon, primary care provider and/or specialists as directed. Reinforced taking medications as prescribed, notify MD of worsening symptoms of shortness of breath , swelling fever. Encouraged regarding attending all appointments as recommended. Verified patient has transportation to Remdesivir infusion  appointment on 10/08/20.   Reviewed [place] healthy lifestyle program information to receive discounted premium for  2023   Step 1: Get  your annual physical  Step 2: Complete your health assessment  Step 3:Identify your current health status and complete the corresponding action step between August 14, 2020 and April 14, 2021.    Patient agreeable to follow up call in the next week assess for ongoing care management coordination needs.  Will route successful outreach letter  with [place]  Management pamphlet and 24 Hour Nurse Line Magnet to [facility] Management clinical pool to be mailed to patient's home address.  Thanked patient for their services to [facility].   Joylene Draft, RN, BSN  [place] Management Coordinator  [phone]- Mobile [phone]- Toll Free Main Office

## 2020-10-08 ENCOUNTER — Ambulatory Visit (HOSPITAL_COMMUNITY)
Admit: 2020-10-08 | Discharge: 2020-10-08 | Disposition: A | Discharge status: Home / Self Care | Payer: No Typology Code available for payment source | Source: Ambulatory Visit | Attending: Pulmonary Disease | Admitting: Pulmonary Disease

## 2020-10-08 ENCOUNTER — Other Ambulatory Visit: Payer: Self-pay

## 2020-10-08 DIAGNOSIS — U071 COVID-19: Secondary | ICD-10-CM | POA: Diagnosis present

## 2020-10-08 DIAGNOSIS — J1282 Pneumonia due to coronavirus disease 2019: Secondary | ICD-10-CM | POA: Insufficient documentation

## 2020-10-08 DIAGNOSIS — E876 Hypokalemia: Secondary | ICD-10-CM

## 2020-10-08 LAB — CULTURE, BLOOD (ROUTINE X 2): Culture: NO GROWTH

## 2020-10-08 MED ORDER — METHYLPREDNISOLONE SODIUM SUCC 125 MG IJ SOLR: 125.0000 mg | Freq: Once | INTRAMUSCULAR | Status: DC | PRN

## 2020-10-08 MED ORDER — DIPHENHYDRAMINE HCL 50 MG/ML IJ SOLN: 50.0000 mg | Freq: Once | INTRAMUSCULAR | Status: DC | PRN

## 2020-10-08 MED ORDER — FAMOTIDINE IN NACL 20-0.9 MG/50ML-% IV SOLN: 20.0000 mg | Freq: Once | INTRAVENOUS | Status: DC | PRN

## 2020-10-08 MED ORDER — ALBUTEROL SULFATE HFA 108 (90 BASE) MCG/ACT IN AERS: 2.0000 | INHALATION_SPRAY | Freq: Once | RESPIRATORY_TRACT | Status: DC | PRN

## 2020-10-08 MED ORDER — POTASSIUM CHLORIDE 20 MEQ PO PACK: 20.0000 meq | PACK | Freq: Two times a day (BID) | ORAL | 0 refills | Status: DC

## 2020-10-08 MED ORDER — EPINEPHRINE 0.3 MG/0.3ML IJ SOAJ: 0.3000 mg | Freq: Once | INTRAMUSCULAR | Status: DC | PRN

## 2020-10-08 MED ORDER — SODIUM CHLORIDE 0.9 % IV SOLN
100.0000 mg | Freq: Once | INTRAVENOUS | Status: AC
  Administered 2020-10-08: 100 mg via INTRAVENOUS

## 2020-10-08 MED ORDER — SODIUM CHLORIDE 0.9 % IV SOLN: INTRAVENOUS | Status: DC | PRN

## 2020-10-08 MED FILL — POTASSIUM CHLORIDE 20 MEQ P: 20 | 15 days supply | Qty: 30 | Fill #0

## 2020-10-08 NOTE — Telephone Encounter (Signed)
Ok to switch to Klor-Con (Potassium) 17meq/pack

## 2020-10-08 NOTE — Progress Notes (Signed)
Patient reviewed Fact Sheet for Patients, Parents, and Caregivers for Emergency Use Authorization (EUA) of Remdesivir for the Treatment of Coronavirus. Patient also reviewed and is agreeable to the estimated cost of treatment. Patient is agreeable to proceed.

## 2020-10-08 NOTE — Discharge Instructions (Signed)
10 Things You Can Do to Manage Your COVID-19 Symptoms at Home °If you have possible or confirmed COVID-19: °1. Stay home except to get medical care. °2. Monitor your symptoms carefully. If your symptoms get worse, call your healthcare provider immediately. °3. Get rest and stay hydrated. °4. If you have a medical appointment, call the healthcare provider ahead of time and tell them that you have or may have COVID-19. °5. For medical emergencies, call 911 and notify the dispatch personnel that you have or may have COVID-19. °6. Cover your cough and sneezes with a tissue or use the inside of your elbow. °7. Wash your hands often with soap and water for at least 20 seconds or clean your hands with an alcohol-based hand sanitizer that contains at least 60% alcohol. °8. As much as possible, stay in a specific room and away from other people in your home. Also, you should use a separate bathroom, if available. If you need to be around other people in or outside of the home, wear a mask. °9. Avoid sharing personal items with other people in your household, like dishes, towels, and bedding. °10. Clean all surfaces that are touched often, like counters, tabletops, and doorknobs. Use household cleaning sprays or wipes according to the label instructions. °cdc.gov/coronavirus °02/27/2020 °This information is not intended to replace advice given to you by your health care provider. Make sure you discuss any questions you have with your health care provider. °Document Revised: 06/14/2020 Document Reviewed: 06/14/2020 °Elsevier Patient Education © 2021 Elsevier Inc. °If you have any questions or concerns after the infusion please call the Advanced Practice Provider on call at [phone]. This number is ONLY intended for your use regarding questions or concerns about the infusion post-treatment side-effects.  Please do not provide this number to others for use. For return to work notes please contact your primary care provider.   ° °If someone you know is interested in receiving treatment please have them contact their MD for a referral or visit [email or web address] ° ° ° °

## 2020-10-08 NOTE — Progress Notes (Signed)
  Diagnosis: COVID-19  Physician: Dr. Asencion Noble  Procedure: Covid Infusion Clinic Med: remdesivir infusion - Provided patient with remdesivir fact sheet for patients, parents and caregivers prior to infusion.  Complications: No immediate complications noted.  Discharge: Discharged home   Koren Shiver 10/08/2020

## 2020-10-08 NOTE — Telephone Encounter (Signed)
Rx sent in to patient pharmacy

## 2020-10-10 LAB — CULTURE, BLOOD (ROUTINE X 2)
Culture: NO GROWTH
Culture: NO GROWTH
Special Requests: ADEQUATE

## 2020-10-13 ENCOUNTER — Other Ambulatory Visit: Payer: Self-pay | Admitting: *Deleted

## 2020-10-13 NOTE — Patient Outreach (Signed)
[facility]) Care Management  10/13/2020  Jennifer Summers 12/09/1980 [id number]   Transition of care follow up  /Case Closure    Referral received:10/06/20 Initial outreach:10/07/20 Insurance: [place] [employer]   Subjective: Successful follow up call to patient, she reports doing okay. She reports improvement and decrease in swelling of lower extremities, denies issue with increase in shortness of breath.She completed final dose of Remdesivir infusion.. She discussed continuing to take fluid pill, he requested different form of potassium from provider as she was having difficulty with swallowing larger potassium pill, she states delay in new form of potassium, anticipates delivery to pharmacy on today and she will be able to obtain. She discussed initial appointment with cardiology in the next week.  [name] denies any other concerns, education or resources needs.    Objective: Jennifer Summers was hospitalized [facility]  from 2/21-2/23/22 for Acute respiratory distress, SIRS,  fluid overload,Lowe extremity edema, covid 19 infection, elevated troponin. PMHx:  Positive Covid 19 on 2/14, Hospital admission 2/15-2/17/21 s/p C-section delivery of baby girl.   She was discharged to home on 10/06/20  without the need for home health servicesor DME.  Plan No ongoing care management needs identified will close case to [facility] care management .    Joylene Draft, RN, BSN  [place] Management Coordinator  [phone]- Mobile [phone]- Toll Free Main Office

## 2020-10-14 ENCOUNTER — Ambulatory Visit: Payer: No Typology Code available for payment source | Admitting: Cardiology

## 2020-10-20 ENCOUNTER — Ambulatory Visit: Payer: No Typology Code available for payment source | Admitting: Cardiology

## 2020-10-20 ENCOUNTER — Encounter: Payer: Self-pay | Admitting: Cardiology

## 2020-10-20 ENCOUNTER — Other Ambulatory Visit: Payer: Self-pay

## 2020-10-20 VITALS — BP 111/76 | HR 66 | Temp 97.3°F | Resp 17 | Ht 63.0 in | Wt 172.6 lb

## 2020-10-20 DIAGNOSIS — D6859 Other primary thrombophilia: Secondary | ICD-10-CM

## 2020-10-20 DIAGNOSIS — Z8616 Personal history of COVID-19: Secondary | ICD-10-CM

## 2020-10-20 DIAGNOSIS — M7989 Other specified soft tissue disorders: Secondary | ICD-10-CM

## 2020-10-20 DIAGNOSIS — R7989 Other specified abnormal findings of blood chemistry: Secondary | ICD-10-CM

## 2020-10-20 NOTE — Progress Notes (Signed)
Date:  10/20/2020   ID:  Jennifer Summers, DOB 01/18/1981, MRN [id number]  PCP:  Midge Minium, MD  Cardiologist:  Rex Kras, DO, [facility] (established care 10/20/2020)  REASON FOR CONSULT: Lower extremity swelling  REQUESTING PHYSICIAN:  Midge Minium, MD [address] [place],  [place] [address]  Chief Complaint  Patient presents with  . Follow-up    7 DAY  . Leg Swelling    HPI  Jennifer Summers is a 40 y.o. 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  She is referred to the office at the request of Midge Minium, MD for evaluation of lower extremity swelling.  Very pleasant African-American female who has been pregnant 6 times and is given boards to 5 kids.  She recently underwent C-section on September 28, 2020 at [facility] and gave birth to a baby girl.  Prior to her C-section she had a Covid test which was noted to be positive.  Since the C-section patient states that she was having lower extremity swelling, difficulty in breathing and has gone to the ER twice due to worsening symptoms.  She was recently discharged/left AGAINST MEDICAL ADVICE on October 06, 2020 for similar symptoms.  Prior to discharge attending physician called for quick medication reconciliation until she follows up in the office.  The work-up thus far included CT chest PE protocol which is negative for pulmonary embolism, echocardiogram noted low normal LVEF of 50-55%, troponins earlier in February were slightly elevated but overall flat response.  Patient shows not to breast-feed and therefore was started on metoprolol for rate control as well as Lasix for diuresis.  She now presents for a formal consultation.  Clinically patient states that she is doing well and does  not have shortness of breath with effort related activities and her lower extremity swelling is completely resolved.  She continues to be on Lasix for now.  No family history of premature coronary artery disease or sudden cardiac death.  Patient states that she does have protein S deficiency and during her pregnancy and postpartum she is currently on Lovenox.  FUNCTIONAL STATUS: No structured exercise program or daily routine.   ALLERGIES: Allergies  Allergen Reactions  . Codeine Other (See Comments)    Unknown childhood reaction.    MEDICATION LIST PRIOR TO VISIT: Current Meds  Medication Sig  . enoxaparin (LOVENOX) 40 MG/0.4ML injection Inject 0.4 mLs (40 mg total) into the skin daily.  . ferrous sulfate (SLOW IRON) 160 (50 Fe) MG TBCR SR tablet Take 160 mg by mouth daily.  Marland Kitchen FLUoxetine (PROZAC) 20 MG tablet TAKE 1 TABLET BY MOUTH DAILY (Patient taking differently: Take 20 mg by mouth daily.)  . hydrOXYzine (ATARAX/VISTARIL) 25 MG tablet Take 25 mg by mouth at bedtime as needed (For sleep.).  Marland Kitchen ibuprofen (ADVIL) 100 MG/5ML suspension Take 30 mLs (600 mg total) by mouth every 6 (six) hours as needed for mild pain or moderate pain.  . metoprolol tartrate (LOPRESSOR) 25 MG tablet Take 1 tablet (25 mg total) by mouth 2 (two) times daily.  . pantoprazole (PROTONIX) 40 MG tablet TAKE 1 TABLET BY MOUTH DAILY. (Patient taking differently: Take 40 mg by mouth daily.)  . Potassium Chloride POWD 20 mg by Does not apply route daily.  . Prenatal Vit-Fe Fumarate-FA (PRENATAL MULTIVITAMIN) TABS  tablet Take 1 tablet by mouth daily at 12 noon.  . simethicone (MYLICON) 80 MG chewable tablet Chew 80 mg by mouth every 6 (six) hours as needed for flatulence.  . [DISCONTINUED] furosemide (LASIX) 40 MG tablet Take 1 tablet (40 mg total) by mouth daily.  . [DISCONTINUED] potassium chloride (KLOR-CON) 20 MEQ packet Take 20 mEq by mouth 2 (two) times daily.     PAST MEDICAL HISTORY: Past Medical History:   Diagnosis Date  . Acid reflux   . Anxiety   . Cervical disc disorder 09/14/2017   Very minimal disc space narrowing C4-5 and C5-6 by x-ray.  . IBS (irritable bowel syndrome)   . Immunity status testing 04/16/2013   Tested positive for immunity measles/mumps/rubella and varicella.  . Iron deficiency anemia    Patient reports having to have iron/blood infusions in the past.  . Migraines   . Pancreatitis   . Placenta previa   . Prediabetes 11/08/2018  . Protein S deficiency ([place])   . Right upper quadrant abdominal pain 04/17/2017  . Vitamin D deficiency     PAST SURGICAL HISTORY: Past Surgical History:  Procedure Laterality Date  . CESAREAN SECTION     x 4.  2007, 2008, 2009, 2010.  2007 was emergent C-section.  Marland Kitchen CESAREAN SECTION N/A 09/28/2020   Procedure: REPEAT CESAREAN SECTION EDC: 10-13-20 ALLERGIES: CODEINE;  Surgeon: Everlene Farrier, MD;  Location: [facility];  Service: Obstetrics;  Laterality: N/A;  . TONSILLECTOMY  2000  . Tubal sterilization reversal  01/2019    FAMILY HISTORY: The patient family history includes Hypertension in her mother.  SOCIAL HISTORY:  The patient  reports that she has never smoked. She has never used smokeless tobacco. She reports that she does not drink alcohol and does not use drugs.  REVIEW OF SYSTEMS: Review of Systems  Constitutional: Negative for chills and fever.  HENT: Negative for hoarse voice and nosebleeds.   Eyes: Negative for discharge, double vision and pain.  Cardiovascular: Negative for chest pain, claudication, dyspnea on exertion, leg swelling, near-syncope, orthopnea, palpitations, paroxysmal nocturnal dyspnea and syncope.  Respiratory: Negative for hemoptysis and shortness of breath.   Musculoskeletal: Negative for muscle cramps and myalgias.  Gastrointestinal: Negative for abdominal pain, constipation, diarrhea, hematemesis, hematochezia, melena, nausea and vomiting.  Neurological: Negative for dizziness and  light-headedness.    PHYSICAL EXAM: Vitals with BMI 10/20/2020 10/08/2020 10/08/2020  Height 5' 3"  - -  Weight 172 lbs 10 oz - -  BMI 16.10 - -  Systolic 960 454 098  Diastolic 76 81 89  Pulse 66 71 71    CONSTITUTIONAL: Well-developed and well-nourished. No acute distress.  SKIN: Skin is warm and dry. No rash noted. No cyanosis. No pallor. No jaundice HEAD: Normocephalic and atraumatic.  EYES: No scleral icterus MOUTH/THROAT: Moist oral membranes.  NECK: No JVD present. No thyromegaly noted. No carotid bruits  LYMPHATIC: No visible cervical adenopathy.  CHEST Normal respiratory effort. No intercostal retractions  LUNGS: Clear to auscultation bilaterally.  No stridor. No wheezes. No rales.  CARDIOVASCULAR: Regular rate and rhythm, positive S1-S2, no murmurs rubs or gallops appreciated. ABDOMINAL: No apparent ascites.  EXTREMITIES: No peripheral edema  HEMATOLOGIC: No significant bruising NEUROLOGIC: Oriented to person, place, and time. Nonfocal. Normal muscle tone.  PSYCHIATRIC: Normal mood and affect. Normal behavior. Cooperative  RADIOLOGY: CTA chest PE protocol: 10/03/2020:  Negative for pulmonary embolus.  Negative chest CT.  CARDIAC DATABASE: EKG: 10/20/2020: Sinus bradycardia, 59 bpm, normal axis, negative precordial T  waves, without underlying injury pattern.    Echocardiogram: 10/05/2020: 1. Left ventricular ejection fraction, by estimation, is 50 to 55%. The left ventricle has low normal function. The left ventricle has no regional wall motion abnormalities. Left ventricular diastolic parameters were normal. The average left ventricular global longitudinal strain is -19.1 %. The global longitudinal strain is normal.  2. Right ventricular systolic function is normal. The right ventricular size is normal. There is mildly elevated pulmonary artery systolic pressure.  3. The mitral valve is normal in structure. Trivial mitral valve regurgitation. No evidence of mitral  stenosis.  4. Tricuspid valve regurgitation is moderate.  5. The aortic valve is tricuspid. Aortic valve regurgitation is not visualized. No aortic stenosis is present.  6. The inferior vena cava is normal in size with <50% respiratory variability, suggesting right atrial pressure of 8 mmHg.   Stress Testing: No results found for this or any previous visit from the past 1095 days.  Heart Catheterization: None  LABORATORY DATA: CBC Latest Ref Rng & Units 10/06/2020 10/05/2020 10/04/2020  WBC 4.0 - 10.5 K/uL 11.1(H) 14.2(H) 13.4(H)  Hemoglobin 12.0 - 15.0 g/dL 10.1(L) 9.1(L) 9.4(L)  Hematocrit 36.0 - 46.0 % 32.9(L) 28.3(L) 30.9(L)  Platelets 150 - 400 K/uL 381 373 359    CMP Latest Ref Rng & Units 10/06/2020 10/05/2020 10/04/2020  Glucose 70 - 99 mg/dL 87 93 87  BUN 6 - 20 mg/dL 5(L) 5(L) 7  Creatinine 0.44 - 1.00 mg/dL 0.76 0.77 0.75  Sodium 135 - 145 mmol/L 142 139 139  Potassium 3.5 - 5.1 mmol/L 3.1(L) 3.0(L) 2.8(L)  Chloride 98 - 111 mmol/L 105 103 103  CO2 22 - 32 mmol/L 24 24 25   Calcium 8.9 - 10.3 mg/dL 8.3(L) 8.0(L) 8.0(L)  Total Protein 6.5 - 8.1 g/dL 5.7(L) 5.4(L) 5.3(L)  Total Bilirubin 0.3 - 1.2 mg/dL 0.6 0.8 0.8  Alkaline Phos 38 - 126 U/L 107 120 127(H)  AST 15 - 41 U/L 13(L) 10(L) 11(L)  ALT 0 - 44 U/L 13 16 18     Lipid Panel     Component Value Date/Time   CHOL 209 (A) 08/23/2018 0000   TRIG 41 08/23/2018 0000   HDL 111 (A) 08/23/2018 0000   LDLCALC 90 08/23/2018 0000    BNP    Component Value Date/Time   BNP 268.0 (H) 10/04/2020 1620    Recent Labs    11/20/19 0906 12/25/19 1159 10/03/20 1528  TSH 0.02* 3.79 0.982    BMP Recent Labs    10/04/20 1620 10/05/20 0525 10/06/20 0118  NA 139 139 142  K 2.8* 3.0* 3.1*  CL 103 103 105  CO2 25 24 24   GLUCOSE 87 93 87  BUN 7 5* 5*  CREATININE 0.75 0.77 0.76  CALCIUM 8.0* 8.0* 8.3*  GFRNONAA >60 >60 >60    HEMOGLOBIN A1C No results found for: HGBA1C, MPG  IMPRESSION:    ICD-10-CM   1.  Leg swelling  M79.89 EKG 12-Lead    CMP14+EGFR    Brain natriuretic peptide    Magnesium  2. Protein S deficiency ([place])  D68.59   3. Elevated brain natriuretic peptide (BNP) level  R79.89 Brain natriuretic peptide  4. History of COVID-19  Z86.16      RECOMMENDATIONS: Lilienne Weins is a 40 y.o. 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  Patient is currently postpartum after her sixth pregnancy and is given births to  5 kids.  During her recent pregnancy she had preoperative lab work which noted that she was Covid 19+.  She underwent C-section and gave birth to a healthy baby girl.  Thereafter she has been experiencing shortness of breath with effort related activities as well as lower extremity swelling.  She has undergone an extensive work-up including CT of the chest with PE protocol which was negative for pulmonary embolism.  Echocardiogram noted low normal LVEF, normal diastolic function, normal longitudinal strain.  No significant valvular abnormality except moderate TR and slightly elevated right atrial pressures.  Since then due to repeated ER visits for similar symptoms she was started on Lasix to help facilitate diuresis.  And she was also started on metoprolol.  Patient informs me that she is currently not breast-feeding and has no intentions to do so.  She also verbalized understanding that if she were to resume breast-feeding medications may need to be changed.  She is asked to contact her providers for further recommendations.  Today clinically patient is euvolemic and does not have any chest pain or shortness of breath at rest or with effort related activities.  This shared decision was to discontinue Lasix for 1 week and repeat her blood work to evaluate her electrolytes specifically potassium levels and BNP as well.  And clinically she is asked to look out  for worsening symptoms of lower extremity swelling or shortness of breath.  If all remains stable the plan will be to discontinue Lasix for now and watch her carefully.  Given the course of events and the fact that she is postpartum I would continue with Lopressor at the current dose for 3 months and then reevaluate.  During the recent hospitalizations troponin levels were also checked and they were at times higher than the normal limits but no significant rise and fall that is usually seen in ACS.  Given her postpartum state I would like to treat her medically for now as she is not having any chest pain or anginal equivalent symptoms but in 3 months she may benefit from exercise treadmill stress test for further risk ratification.  Patient is agreeable with the plan of care and her questions and concerns were addressed to her satisfaction.  FINAL MEDICATION LIST END OF ENCOUNTER: No orders of the defined types were placed in this encounter.   Medications Discontinued During This Encounter  Medication Reason  . albuterol (VENTOLIN HFA) 108 (90 Base) MCG/ACT inhaler Error  . potassium chloride 20 MEQ TBCR Error  . potassium chloride (KLOR-CON) 20 MEQ packet Patient Preference  . furosemide (LASIX) 40 MG tablet Discontinued by provider     Current Outpatient Medications:  .  enoxaparin (LOVENOX) 40 MG/0.4ML injection, Inject 0.4 mLs (40 mg total) into the skin daily., Disp: 10 mL, Rfl: 1 .  ferrous sulfate (SLOW IRON) 160 (50 Fe) MG TBCR SR tablet, Take 160 mg by mouth daily., Disp: , Rfl:  .  FLUoxetine (PROZAC) 20 MG tablet, TAKE 1 TABLET BY MOUTH DAILY (Patient taking differently: Take 20 mg by mouth daily.), Disp: 90 tablet, Rfl: 1 .  hydrOXYzine (ATARAX/VISTARIL) 25 MG tablet, Take 25 mg by mouth at bedtime as needed (For sleep.)., Disp: , Rfl:  .  ibuprofen (ADVIL) 100 MG/5ML suspension, Take 30 mLs (600 mg total) by mouth every 6 (six) hours as needed for mild pain or moderate pain., Disp:  473 mL, Rfl: 1 .  metoprolol tartrate (LOPRESSOR) 25 MG tablet, Take 1 tablet (25 mg total) by mouth  2 (two) times daily., Disp: 60 tablet, Rfl: 0 .  pantoprazole (PROTONIX) 40 MG tablet, TAKE 1 TABLET BY MOUTH DAILY. (Patient taking differently: Take 40 mg by mouth daily.), Disp: 90 tablet, Rfl: 1 .  Potassium Chloride POWD, 20 mg by Does not apply route daily., Disp: , Rfl:  .  Prenatal Vit-Fe Fumarate-FA (PRENATAL MULTIVITAMIN) TABS tablet, Take 1 tablet by mouth daily at 12 noon., Disp: , Rfl:  .  simethicone (MYLICON) 80 MG chewable tablet, Chew 80 mg by mouth every 6 (six) hours as needed for flatulence., Disp: , Rfl:   Orders Placed This Encounter  Procedures  . CMP14+EGFR  . Brain natriuretic peptide  . Magnesium  . EKG 12-Lead    There are no Patient Instructions on file for this visit.   --Continue cardiac medications as reconciled in final medication list. --Return in about 3 months (around 01/20/2021) for Follow up postpartum, lower extremity swelling, repeat hospitalizations.. Or sooner if needed. --Continue follow-up with your primary care physician regarding the management of your other chronic comorbid conditions.  Patient's questions and concerns were addressed to her satisfaction. She voices understanding of the instructions provided during this encounter.   This note was created using a voice recognition software as a result there may be grammatical errors inadvertently enclosed that do not reflect the nature of this encounter. Every attempt is made to correct such errors.  Rex Kras, [place], [facility]  Pager: [phone] Office: [phone]

## 2020-10-21 ENCOUNTER — Other Ambulatory Visit: Payer: Self-pay

## 2020-10-21 DIAGNOSIS — M7989 Other specified soft tissue disorders: Secondary | ICD-10-CM

## 2020-10-21 DIAGNOSIS — R7989 Other specified abnormal findings of blood chemistry: Secondary | ICD-10-CM

## 2020-11-04 LAB — CMP14+EGFR
ALT: 16 IU/L (ref 0–32)
AST: 20 IU/L (ref 0–40)
Albumin/Globulin Ratio: 1.9 (ref 1.2–2.2)
Albumin: 4.7 g/dL (ref 3.8–4.8)
Alkaline Phosphatase: 112 IU/L (ref 44–121)
BUN/Creatinine Ratio: 8 — ABNORMAL LOW (ref 9–23)
BUN: 8 mg/dL (ref 6–20)
Bilirubin Total: 0.3 mg/dL (ref 0.0–1.2)
CO2: 21 mmol/L (ref 20–29)
Calcium: 9.3 mg/dL (ref 8.7–10.2)
Chloride: 104 mmol/L (ref 96–106)
Creatinine, Ser: 1 mg/dL (ref 0.57–1.00)
Globulin, Total: 2.5 g/dL (ref 1.5–4.5)
Glucose: 86 mg/dL (ref 65–99)
Potassium: 4.5 mmol/L (ref 3.5–5.2)
Sodium: 141 mmol/L (ref 134–144)
Total Protein: 7.2 g/dL (ref 6.0–8.5)
eGFR: 73 mL/min/{1.73_m2} (ref >59)

## 2020-11-04 LAB — BRAIN NATRIURETIC PEPTIDE: BNP: 4.1 pg/mL (ref 0.0–100.0)

## 2020-11-04 LAB — MAGNESIUM: Magnesium: 2 mg/dL (ref 1.6–2.3)

## 2020-11-05 ENCOUNTER — Other Ambulatory Visit (HOSPITAL_BASED_OUTPATIENT_CLINIC_OR_DEPARTMENT_OTHER): Payer: Self-pay

## 2020-11-08 ENCOUNTER — Other Ambulatory Visit: Payer: Self-pay

## 2020-11-08 ENCOUNTER — Ambulatory Visit (INDEPENDENT_AMBULATORY_CARE_PROVIDER_SITE_OTHER): Payer: No Typology Code available for payment source | Admitting: Family Medicine

## 2020-11-08 ENCOUNTER — Other Ambulatory Visit: Payer: Self-pay | Admitting: Family Medicine

## 2020-11-08 ENCOUNTER — Encounter: Payer: Self-pay | Admitting: Family Medicine

## 2020-11-08 VITALS — BP 112/80 | HR 81 | Temp 97.5°F | Resp 17 | Ht 63.0 in | Wt 172.4 lb

## 2020-11-08 DIAGNOSIS — E8779 Other fluid overload: Secondary | ICD-10-CM | POA: Diagnosis not present

## 2020-11-08 DIAGNOSIS — D6859 Other primary thrombophilia: Secondary | ICD-10-CM

## 2020-11-08 DIAGNOSIS — F32A Depression, unspecified: Secondary | ICD-10-CM | POA: Diagnosis not present

## 2020-11-08 DIAGNOSIS — F419 Anxiety disorder, unspecified: Secondary | ICD-10-CM

## 2020-11-08 MED ORDER — FLUOXETINE HCL 40 MG PO CAPS: 40.0000 mg | ORAL_CAPSULE | Freq: Every day | ORAL | 1 refills | Status: DC

## 2020-11-08 MED ORDER — PANTOPRAZOLE SODIUM 40 MG PO TBEC: 40.0000 mg | DELAYED_RELEASE_TABLET | Freq: Every day | ORAL | 1 refills | Status: DC

## 2020-11-08 MED FILL — PANTOPRAZOLE SOD DR 40 MG T: 40 | 90 days supply | Qty: 90 | Fill #0

## 2020-11-08 MED FILL — FLUoxetine HCL 40 MG CAPS: 40 | 90 days supply | Qty: 90 | Fill #0

## 2020-11-08 NOTE — Assessment & Plan Note (Signed)
Deteriorated.  Pt is overwhelmed by her complicated post-partum course: COVID, volume overload, SIRS, isolation.  She doesn't have suicidal or homicidal thoughts but does report some sadness.  Will increase Prozac to 40mg  daily and monitor closely for improvement.

## 2020-11-08 NOTE — Assessment & Plan Note (Signed)
Resolved.  Pt has been off Lasix for ~2 weeks and has not had recurrence of sxs.  Has followed up w/ Cardiology and has appt upcoming in June.  Will follow going forward.

## 2020-11-08 NOTE — Progress Notes (Signed)
   Subjective:    Patient ID: Jennifer Summers, female    DOB: 02-22-1981, 40 y.o.   MRN: [id number]  [facility] f/u- pt was admitted 2/21-2/23 for SOB, LE edema, and fever.  She was COVID +.  She had no PE or PNA on CTA.  LEs were negative for DVT, ECHO 50-55%.  She was tx'd w/ Remdesivir.  She left hospital AMA on 2/23 to go home to her baby.  Since D/C, BP has been well controlled on Metoprolol- she stopped this ~2 weeks ago.  She has lost considerable weight- 16 kg since d/c.  Pt is no longer swelling- was able to stop Lasix ~2 weeks ago.  No recurrence of swelling.  Has followed up w/ Cardiology and has 1 more f/u appt in June.  No SOB, no CP  Anxiety/depression- pt has some episodes of sadness but no thoughts of self harm or harming others.  Gets overwhelmed.  Is lonely at home w/ just the baby.  Pt is interested in going up on Prozac.  Protein S deficiency- pt was to complete 6 weeks of Lovenox after delivery.  Tomorrow is her 6 week checkup so this will likely stop.    Review of Systems For ROS see HPI   This visit occurred during the SARS-CoV-2 public health emergency.  Safety protocols were in place, including screening questions prior to the visit, additional usage of staff PPE, and extensive cleaning of exam room while observing appropriate contact time as indicated for disinfecting solutions.       Objective:   Physical Exam Vitals reviewed.  Constitutional:      General: She is not in acute distress.    Appearance: Normal appearance. She is not ill-appearing.  HENT:     Head: Normocephalic and atraumatic.  Eyes:     Extraocular Movements: Extraocular movements intact.     Conjunctiva/sclera: Conjunctivae normal.     Pupils: Pupils are equal, round, and reactive to light.  Neck:     Comments: No thyromegaly present Cardiovascular:     Rate and Rhythm: Normal rate and regular rhythm.     Pulses: Normal pulses.     Heart sounds: Normal heart sounds.  Pulmonary:      Effort: Pulmonary effort is normal. No respiratory distress.     Breath sounds: Normal breath sounds. No wheezing, rhonchi or rales.  Abdominal:     General: There is no distension.     Palpations: Abdomen is soft.     Tenderness: There is no abdominal tenderness. There is no guarding.  Musculoskeletal:     Cervical back: Normal range of motion and neck supple.     Right lower leg: No edema.     Left lower leg: No edema.  Lymphadenopathy:     Cervical: No cervical adenopathy.  Skin:    General: Skin is warm and dry.  Neurological:     General: No focal deficit present.     Mental Status: She is alert and oriented to person, place, and time.  Psychiatric:        Mood and Affect: Mood normal.        Behavior: Behavior normal.        Thought Content: Thought content normal.           Assessment & Plan:

## 2020-11-08 NOTE — Patient Instructions (Addendum)
Follow up in 6-8 weeks to recheck mood.  Sooner if needed No need for labs today- yay!!! Increase the Prozac to 40mg  daily- 2 of what you have at home and 1 of the new prescription Call with any questions or concerns Keep up the good work!  You're doing great!!  And don't let anyone tell you otherwise!!

## 2020-11-08 NOTE — Assessment & Plan Note (Signed)
Pt was to continue her Lovenox for 6 weeks post c-section.  Tomorrow is her 6 week f/u so she thinks she will be cleared to stop this medication.

## 2020-11-22 ENCOUNTER — Telehealth: Payer: Self-pay

## 2020-11-22 NOTE — Telephone Encounter (Signed)
Called and spoke to pt regarding test results. Pt has not had any SOB or leg swelling. She stopped taking the lasix and stated that she is doing okay. Pt was informed to call if she had any questions.

## 2020-11-23 ENCOUNTER — Ambulatory Visit (INDEPENDENT_AMBULATORY_CARE_PROVIDER_SITE_OTHER): Payer: No Typology Code available for payment source | Admitting: Family Medicine

## 2020-11-23 ENCOUNTER — Other Ambulatory Visit: Payer: Self-pay

## 2020-11-23 ENCOUNTER — Encounter: Payer: Self-pay | Admitting: Family Medicine

## 2020-11-23 DIAGNOSIS — E669 Obesity, unspecified: Secondary | ICD-10-CM | POA: Diagnosis not present

## 2020-11-23 DIAGNOSIS — O99215 Obesity complicating the puerperium: Secondary | ICD-10-CM

## 2020-11-23 NOTE — Progress Notes (Signed)
Patient has been made aware.

## 2020-11-23 NOTE — Patient Instructions (Signed)
Follow up in 3-4 months to recheck weight loss progress Make sure you are eating regularly to give your body the fuel it needs Keep up the good work on healthy diet and regularly exercise- you're doing GREAT!!! Call with any questions or concerns Give yourself a break!!!  You are doing AWESOME!!!

## 2020-11-23 NOTE — Telephone Encounter (Signed)
Thanks

## 2020-11-23 NOTE — Progress Notes (Signed)
   Subjective:    Patient ID: Jennifer Summers, female    DOB: 1981/01/30, 40 y.o.   MRN: [id number]  HPI Obesity- pt is 8 weeks post partum.  BMI is 31.42.  At time of delivery, weight was 204 and BMI was 36.15.  She has already lost 30 lbs.  Pt states she has been working out x4 weeks.  Doing YouTube videos for exercise.  Is not eating 3 meals/day.  Will eat breakfast- oatmeal, banana, rice cakes, bacon.  Lunch- leftovers.  Not eating dinner due to fatigue at the end of the day.  This is baby #5, pt is 43 yrs old.     Review of Systems For ROS see HPI   This visit occurred during the SARS-CoV-2 public health emergency.  Safety protocols were in place, including screening questions prior to the visit, additional usage of staff PPE, and extensive cleaning of exam room while observing appropriate contact time as indicated for disinfecting solutions.       Objective:   Physical Exam Vitals reviewed.  Constitutional:      General: She is not in acute distress.    Appearance: Normal appearance. She is obese.  HENT:     Head: Normocephalic and atraumatic.  Eyes:     Extraocular Movements: Extraocular movements intact.     Conjunctiva/sclera: Conjunctivae normal.     Pupils: Pupils are equal, round, and reactive to light.  Musculoskeletal:     Right lower leg: No edema.     Left lower leg: No edema.  Skin:    General: Skin is warm and dry.  Neurological:     General: No focal deficit present.     Mental Status: She is alert and oriented to person, place, and time.     Cranial Nerves: No cranial nerve deficit.     Motor: No weakness.     Gait: Gait normal.  Psychiatric:        Mood and Affect: Mood normal.        Behavior: Behavior normal.        Thought Content: Thought content normal.           Assessment & Plan:  Obesity- new.  Reviewed that this is to be expected after pregnancy.  This is baby #5 and she is 64 years old- it's going to take longer than her previous  pregnancies to lose the weight.  She is exercising regularly and I applauded her efforts and commended her for finding the time.  Told her that it's ok if she's in survival mode at this point.  Did discuss the need to eat more regularly throughout the day so the body doesn't go into starvation mode.  Encouraged grazing or frequent snacks.  Encouraged low carb diet.  Will continue to follow.

## 2020-12-23 ENCOUNTER — Ambulatory Visit: Payer: No Typology Code available for payment source | Admitting: Family Medicine

## 2021-01-20 ENCOUNTER — Ambulatory Visit: Payer: No Typology Code available for payment source | Admitting: Cardiology

## 2021-02-09 ENCOUNTER — Encounter: Payer: Self-pay | Admitting: *Deleted

## 2021-02-24 ENCOUNTER — Ambulatory Visit: Payer: No Typology Code available for payment source | Admitting: Family Medicine

## 2021-03-02 ENCOUNTER — Other Ambulatory Visit (HOSPITAL_COMMUNITY): Payer: Self-pay

## 2021-03-02 ENCOUNTER — Other Ambulatory Visit (HOSPITAL_BASED_OUTPATIENT_CLINIC_OR_DEPARTMENT_OTHER): Payer: Self-pay

## 2021-03-02 ENCOUNTER — Encounter: Payer: Self-pay | Admitting: Family Medicine

## 2021-03-02 ENCOUNTER — Ambulatory Visit (INDEPENDENT_AMBULATORY_CARE_PROVIDER_SITE_OTHER): Payer: No Typology Code available for payment source | Admitting: Family Medicine

## 2021-03-02 ENCOUNTER — Other Ambulatory Visit: Payer: Self-pay

## 2021-03-02 VITALS — BP 115/70 | HR 74 | Temp 97.4°F | Resp 18 | Ht 63.0 in | Wt 177.6 lb

## 2021-03-02 DIAGNOSIS — E669 Obesity, unspecified: Secondary | ICD-10-CM | POA: Diagnosis not present

## 2021-03-02 DIAGNOSIS — F32A Depression, unspecified: Secondary | ICD-10-CM | POA: Diagnosis not present

## 2021-03-02 DIAGNOSIS — F419 Anxiety disorder, unspecified: Secondary | ICD-10-CM | POA: Diagnosis not present

## 2021-03-02 DIAGNOSIS — O99215 Obesity complicating the puerperium: Secondary | ICD-10-CM | POA: Diagnosis not present

## 2021-03-02 MED ORDER — PANTOPRAZOLE SODIUM 40 MG PO TBEC
DELAYED_RELEASE_TABLET | Freq: Every day | ORAL | 1 refills | Status: DC
  Filled 2021-03-02: qty 90, 90d supply, fill #0
  Filled 2022-02-15: qty 30, 30d supply, fill #1

## 2021-03-02 MED ORDER — BUPROPION HCL ER (XL) 150 MG PO TB24
150.0000 mg | ORAL_TABLET | Freq: Every day | ORAL | 3 refills | Status: DC
Start: 2021-03-02 — End: 2022-07-17
  Filled 2021-03-02: qty 30, 30d supply, fill #0
  Filled 2021-04-14: qty 30, 30d supply, fill #1

## 2021-03-02 NOTE — Assessment & Plan Note (Signed)
Deteriorated.  Pt is having a hard time w/ sadness, fatigue, hopelessness, and feeling overwhelmed.  She is also very upset about her weight.  We will start Wellbutrin to improve both mood and hopefully help w/ appetite.  Pt expressed understanding and is in agreement w/ plan.

## 2021-03-02 NOTE — Patient Instructions (Addendum)
Follow up in early September to recheck mood CONTINUE the Fluoxetine daily ADD the Wellbutrin once daily Continue to take time for yourself to exercise and clear your head! Continue to work on low carb diet (not NO carb!)- you can do it! Call with any questions or concerns Hang in there!!!

## 2021-03-02 NOTE — Progress Notes (Signed)
Rooming in progress

## 2021-03-02 NOTE — Progress Notes (Signed)
   Subjective:    Patient ID: Jennifer Summers, female    DOB: 1981/04/02, 40 y.o.   MRN: [id number]  HPI Obesity- pt's weight is stable at 177 lbs.  Pt reports she's doing 'as well as can be expected'.  Says 'i got a lot going on'.  Her weight is a big concern.  She is exercising 20-30 minutes in the mornings.  Will eat low carb during the week but then will almost binge on the weekends.  Not exercising on the weekends.  Not cooking on the weekends- takeout.  Has had lots of recent celebrations- 'and you just eat what's there'.  Pt is in 'survival mode'.  Pt reports feeling down and sometimes hopeless.  Feeling overwhelmed.     Review of Systems For ROS see HPI   This visit occurred during the SARS-CoV-2 public health emergency.  Safety protocols were in place, including screening questions prior to the visit, additional usage of staff PPE, and extensive cleaning of exam room while observing appropriate contact time as indicated for disinfecting solutions.      Objective:   Physical Exam Vitals reviewed.  Constitutional:      General: She is not in acute distress.    Appearance: Normal appearance. She is not ill-appearing.  HENT:     Head: Normocephalic and atraumatic.  Skin:    General: Skin is warm and dry.  Neurological:     General: No focal deficit present.     Mental Status: She is alert and oriented to person, place, and time.  Psychiatric:        Behavior: Behavior normal.        Thought Content: Thought content normal.     Comments: Flat affect, appears tired          Assessment & Plan:

## 2021-03-02 NOTE — Assessment & Plan Note (Signed)
Pt's weight is stable at 177.  BMI 31.46  Reminded her that she was only 5 months postpartum and she had time to lose the weight.  Discussed low carb diet.  Limiting her cheat days/weekends.  Getting regular exercise.  Will continue to follow closely as this is a stressor for pt.

## 2021-04-14 ENCOUNTER — Other Ambulatory Visit: Payer: Self-pay | Admitting: Family Medicine

## 2021-04-14 ENCOUNTER — Other Ambulatory Visit: Payer: Self-pay | Admitting: Physician Assistant

## 2021-04-14 ENCOUNTER — Other Ambulatory Visit (HOSPITAL_BASED_OUTPATIENT_CLINIC_OR_DEPARTMENT_OTHER): Payer: Self-pay

## 2021-04-14 MED FILL — Fluoxetine HCl Cap 40 MG: ORAL | Qty: 90 | Fill #0 | Status: CN

## 2021-04-14 MED FILL — Fluoxetine HCl Cap 40 MG: ORAL | 90 days supply | Qty: 90 | Fill #0 | Status: AC

## 2021-04-15 ENCOUNTER — Other Ambulatory Visit: Payer: Self-pay | Admitting: Family Medicine

## 2021-04-15 ENCOUNTER — Other Ambulatory Visit (HOSPITAL_BASED_OUTPATIENT_CLINIC_OR_DEPARTMENT_OTHER): Payer: Self-pay

## 2021-04-19 ENCOUNTER — Other Ambulatory Visit (HOSPITAL_BASED_OUTPATIENT_CLINIC_OR_DEPARTMENT_OTHER): Payer: Self-pay

## 2021-04-19 ENCOUNTER — Other Ambulatory Visit: Payer: Self-pay | Admitting: Family Medicine

## 2021-04-20 ENCOUNTER — Other Ambulatory Visit (HOSPITAL_BASED_OUTPATIENT_CLINIC_OR_DEPARTMENT_OTHER): Payer: Self-pay

## 2021-04-20 MED ORDER — PROPRANOLOL HCL 10 MG PO TABS
ORAL_TABLET | Freq: Three times a day (TID) | ORAL | 0 refills | Status: DC | PRN
  Filled 2021-04-20: qty 90, 30d supply, fill #0

## 2021-04-27 ENCOUNTER — Encounter: Payer: Self-pay | Admitting: Family Medicine

## 2021-05-02 ENCOUNTER — Encounter: Payer: Self-pay | Admitting: Family Medicine

## 2021-05-03 ENCOUNTER — Other Ambulatory Visit (HOSPITAL_BASED_OUTPATIENT_CLINIC_OR_DEPARTMENT_OTHER): Payer: Self-pay

## 2021-05-05 ENCOUNTER — Encounter: Payer: Self-pay | Admitting: Family Medicine

## 2021-05-12 ENCOUNTER — Encounter: Payer: Self-pay | Admitting: Family Medicine

## 2021-05-12 ENCOUNTER — Other Ambulatory Visit (HOSPITAL_BASED_OUTPATIENT_CLINIC_OR_DEPARTMENT_OTHER): Payer: Self-pay

## 2021-05-12 ENCOUNTER — Ambulatory Visit (INDEPENDENT_AMBULATORY_CARE_PROVIDER_SITE_OTHER): Payer: No Typology Code available for payment source | Admitting: Family Medicine

## 2021-05-12 ENCOUNTER — Other Ambulatory Visit: Payer: Self-pay

## 2021-05-12 VITALS — BP 122/82 | HR 82 | Temp 97.6°F | Resp 16 | Ht 63.5 in | Wt 176.8 lb

## 2021-05-12 DIAGNOSIS — Z23 Encounter for immunization: Secondary | ICD-10-CM | POA: Diagnosis not present

## 2021-05-12 DIAGNOSIS — F32A Depression, unspecified: Secondary | ICD-10-CM

## 2021-05-12 DIAGNOSIS — F419 Anxiety disorder, unspecified: Secondary | ICD-10-CM

## 2021-05-12 DIAGNOSIS — E669 Obesity, unspecified: Secondary | ICD-10-CM | POA: Diagnosis not present

## 2021-05-12 DIAGNOSIS — O99215 Obesity complicating the puerperium: Secondary | ICD-10-CM

## 2021-05-12 MED ORDER — PHENTERMINE HCL 37.5 MG PO CAPS
37.5000 mg | ORAL_CAPSULE | ORAL | 0 refills | Status: DC
  Filled 2021-05-12: qty 90, 90d supply, fill #0

## 2021-05-12 NOTE — Assessment & Plan Note (Signed)
Ongoing issue for pt.  She continues to have difficulty losing weight despite regular exercise.  She has used phentermine in the past and been successful.  Will restart and monitor for improvement.  Pt instructed to make healthy food choices and continue to exercise while on medication.  Will follow.

## 2021-05-12 NOTE — Patient Instructions (Signed)
Follow up in 6 weeks to recheck weight loss progress on medicine Start the Phentermine as directed in the morning Continue to work on healthy diet and regular exercise- you're doing great! Call with any questions or concerns Happy Fall!!!

## 2021-05-12 NOTE — Progress Notes (Signed)
   Subjective:    Patient ID: Jennifer Summers, female    DOB: Dec 05, 1980, 40 y.o.   MRN: [id number]  HPI Obesity- pt's BMI is 30.83.  Is interested in phentermine.  Has taken Phentermine in the past and found that her body quickly adapts to the medication so she finds that taking it in starts and stops is more beneficial.  No hx of palpitations or increased anxiety  Anxiety/Depression- chronic problem, on Wellbutrin 150mg  daily and Prozac 40mg  daily.  Pt feels that mood is 'pretty good'.  She is able to be home more, walking regularly.   Review of Systems For ROS see HPI   This visit occurred during the SARS-CoV-2 public health emergency.  Safety protocols were in place, including screening questions prior to the visit, additional usage of staff PPE, and extensive cleaning of exam room while observing appropriate contact time as indicated for disinfecting solutions.      Objective:   Physical Exam Vitals reviewed.  Constitutional:      General: She is not in acute distress.    Appearance: Normal appearance. She is not ill-appearing.  HENT:     Head: Normocephalic and atraumatic.  Cardiovascular:     Rate and Rhythm: Normal rate and regular rhythm.     Pulses: Normal pulses.  Pulmonary:     Effort: Pulmonary effort is normal. No respiratory distress.     Breath sounds: Normal breath sounds. No wheezing or rhonchi.  Abdominal:     General: Abdomen is flat. There is no distension.     Palpations: Abdomen is soft.     Tenderness: There is no abdominal tenderness. There is no guarding or rebound.  Skin:    General: Skin is warm and dry.  Neurological:     General: No focal deficit present.     Mental Status: She is alert and oriented to person, place, and time.  Psychiatric:        Mood and Affect: Mood normal.        Behavior: Behavior normal.        Thought Content: Thought content normal.          Assessment & Plan:

## 2021-05-12 NOTE — Assessment & Plan Note (Signed)
Improved.  Feels medication is working, not having job stress is very helpful, and she is walking regularly.  No med changes at this time.  Will follow.

## 2021-05-13 ENCOUNTER — Other Ambulatory Visit (HOSPITAL_BASED_OUTPATIENT_CLINIC_OR_DEPARTMENT_OTHER): Payer: Self-pay

## 2021-05-28 ENCOUNTER — Encounter: Payer: Self-pay | Admitting: Family Medicine

## 2021-08-22 ENCOUNTER — Other Ambulatory Visit: Payer: Self-pay | Admitting: Family Medicine

## 2021-08-22 ENCOUNTER — Other Ambulatory Visit (HOSPITAL_BASED_OUTPATIENT_CLINIC_OR_DEPARTMENT_OTHER): Payer: Self-pay

## 2021-08-22 MED ORDER — PHENTERMINE HCL 37.5 MG PO CAPS: 37.5000 mg | ORAL_CAPSULE | ORAL | 0 refills | Status: DC

## 2021-08-23 ENCOUNTER — Other Ambulatory Visit: Payer: Self-pay | Admitting: Family Medicine

## 2021-08-23 ENCOUNTER — Other Ambulatory Visit (HOSPITAL_BASED_OUTPATIENT_CLINIC_OR_DEPARTMENT_OTHER): Payer: Self-pay

## 2021-08-24 ENCOUNTER — Other Ambulatory Visit (HOSPITAL_COMMUNITY): Payer: Self-pay

## 2021-08-24 ENCOUNTER — Other Ambulatory Visit (HOSPITAL_BASED_OUTPATIENT_CLINIC_OR_DEPARTMENT_OTHER): Payer: Self-pay

## 2021-08-24 ENCOUNTER — Encounter: Payer: Self-pay | Admitting: Family Medicine

## 2021-08-24 ENCOUNTER — Other Ambulatory Visit: Payer: Self-pay | Admitting: Family Medicine

## 2021-08-24 MED ORDER — PHENTERMINE HCL 37.5 MG PO CAPS
37.5000 mg | ORAL_CAPSULE | ORAL | 0 refills | Status: DC
  Filled 2021-08-24: qty 90, 90d supply, fill #0

## 2021-08-24 NOTE — Telephone Encounter (Signed)
Erica received call from the pharmacy stating medication denied but states they also did not receive electronically. Should this be resent escribe?

## 2021-08-24 NOTE — Telephone Encounter (Signed)
Prescription sent

## 2021-08-24 NOTE — Telephone Encounter (Signed)
Pharmacy called in stating that the medication was denied but it looks like the script said print. Can we send in this in electronically?   Medication phentermine   Please advise

## 2021-08-25 ENCOUNTER — Other Ambulatory Visit (HOSPITAL_COMMUNITY): Payer: Self-pay

## 2021-09-12 ENCOUNTER — Encounter: Payer: Self-pay | Admitting: Family Medicine

## 2021-09-12 ENCOUNTER — Other Ambulatory Visit (INDEPENDENT_AMBULATORY_CARE_PROVIDER_SITE_OTHER): Payer: Self-pay

## 2021-09-12 DIAGNOSIS — N912 Amenorrhea, unspecified: Secondary | ICD-10-CM

## 2021-09-12 LAB — HCG, QUANTITATIVE, PREGNANCY: Quantitative HCG: 4.57 m[IU]/mL

## 2021-09-16 ENCOUNTER — Telehealth: Payer: Self-pay

## 2021-09-16 ENCOUNTER — Other Ambulatory Visit (INDEPENDENT_AMBULATORY_CARE_PROVIDER_SITE_OTHER): Payer: Self-pay

## 2021-09-16 DIAGNOSIS — N912 Amenorrhea, unspecified: Secondary | ICD-10-CM

## 2021-09-16 LAB — HCG, QUANTITATIVE, PREGNANCY: Quantitative HCG: 3.37 m[IU]/mL

## 2021-09-16 NOTE — Telephone Encounter (Signed)
-----   Message from Midge Minium, MD sent at 09/16/2021 12:43 PM EST ----- Jennifer Summers is decreasing rather than increasing- seeming to indicate a non-viable pregnancy.  I would still follow up w/ GYN to be sure.  I'm so sorry.

## 2021-09-16 NOTE — Telephone Encounter (Signed)
Patient is aware of labs °

## 2021-09-23 ENCOUNTER — Ambulatory Visit: Payer: Self-pay | Admitting: Family Medicine

## 2021-11-07 ENCOUNTER — Other Ambulatory Visit (HOSPITAL_BASED_OUTPATIENT_CLINIC_OR_DEPARTMENT_OTHER): Payer: Self-pay

## 2021-11-07 ENCOUNTER — Other Ambulatory Visit: Payer: Self-pay | Admitting: Family Medicine

## 2021-11-07 MED ORDER — PROPRANOLOL HCL 10 MG PO TABS
ORAL_TABLET | Freq: Three times a day (TID) | ORAL | 0 refills | Status: DC | PRN
  Filled 2021-11-07 – 2022-02-15 (×2): qty 90, 30d supply, fill #0

## 2021-11-07 MED ORDER — FLUOXETINE HCL 40 MG PO CAPS
ORAL_CAPSULE | Freq: Every day | ORAL | 1 refills | Status: DC
  Filled 2021-11-07: qty 90, 90d supply, fill #0

## 2021-11-17 ENCOUNTER — Encounter (HOSPITAL_BASED_OUTPATIENT_CLINIC_OR_DEPARTMENT_OTHER): Payer: Self-pay

## 2021-11-18 ENCOUNTER — Other Ambulatory Visit (HOSPITAL_BASED_OUTPATIENT_CLINIC_OR_DEPARTMENT_OTHER): Payer: Self-pay

## 2022-02-15 ENCOUNTER — Telehealth: Payer: Medicaid Other | Admitting: Family Medicine

## 2022-02-15 ENCOUNTER — Telehealth: Payer: Self-pay | Admitting: Family Medicine

## 2022-02-15 ENCOUNTER — Other Ambulatory Visit (HOSPITAL_BASED_OUTPATIENT_CLINIC_OR_DEPARTMENT_OTHER): Payer: Self-pay

## 2022-02-15 ENCOUNTER — Other Ambulatory Visit: Payer: Self-pay | Admitting: Family Medicine

## 2022-02-15 MED ORDER — PHENTERMINE HCL 37.5 MG PO CAPS
37.5000 mg | ORAL_CAPSULE | ORAL | 0 refills | Status: DC
  Filled 2022-02-15: qty 30, 30d supply, fill #0
  Filled 2022-07-04: qty 30, 30d supply, fill #1

## 2022-02-15 NOTE — Telephone Encounter (Signed)
Pt called in asking for a refill on the phentermine, pt uses [facility] in [place].  She has an appt on 02/24/22

## 2022-02-15 NOTE — Telephone Encounter (Signed)
Pt has not had phentermine since mid March refill is not appropriate

## 2022-02-16 ENCOUNTER — Other Ambulatory Visit (HOSPITAL_BASED_OUTPATIENT_CLINIC_OR_DEPARTMENT_OTHER): Payer: Self-pay

## 2022-02-24 ENCOUNTER — Other Ambulatory Visit (HOSPITAL_BASED_OUTPATIENT_CLINIC_OR_DEPARTMENT_OTHER): Payer: Self-pay

## 2022-02-24 ENCOUNTER — Telehealth: Payer: Medicaid Other | Admitting: Family Medicine

## 2022-03-13 IMAGING — CT CT ANGIO CHEST
2 of 6 series · 19 of 36 positions shown · IV contrast (omnipaque)
Comparison: Single-view of the chest today.

CLINICAL DATA: Shortness of breath and bilateral lower extremity
swelling for 2 days. Elevated D-dimer.

EXAM:
CT ANGIOGRAPHY CHEST WITH CONTRAST
TECHNIQUE: Multidetector CT imaging of the chest was performed using the
standard protocol during bolus administration of intravenous
contrast. Multiplanar CT image reconstructions and MIPs were
obtained to evaluate the vascular anatomy.
CONTRAST:  80 mL OMNIPAQUE IOHEXOL 350 MG/ML SOLN

[Series 7: pe thins · axial · 0.70mm/px · z∈[+1160,+1423]mm · 18 of 417 slices shown]
[im 21/417  lung]
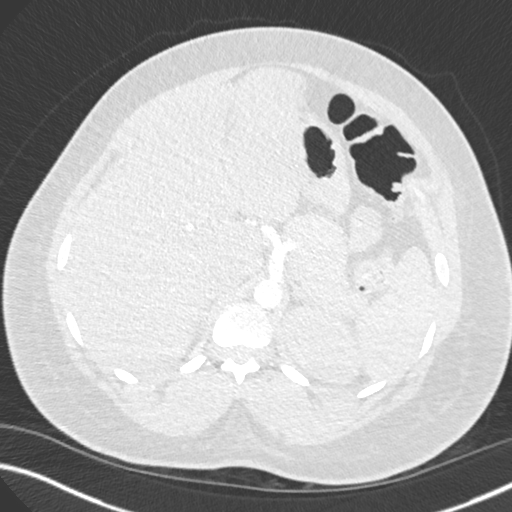
[im 42/417  mediastinal]
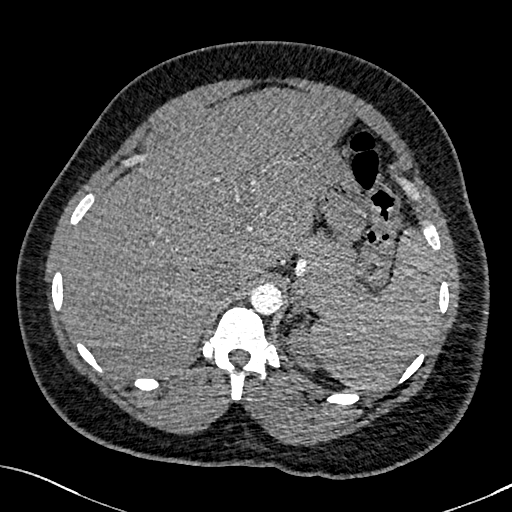
[im 63/417  lung]
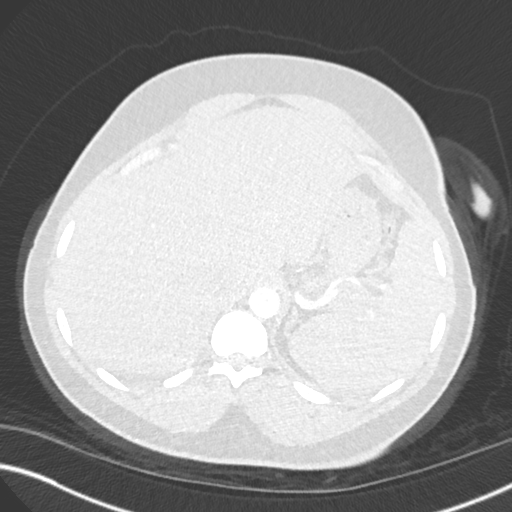
[im 84/417  mediastinal]
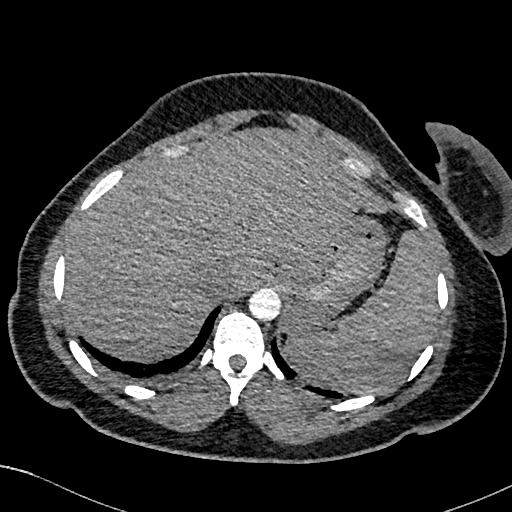
[im 105/417  lung]
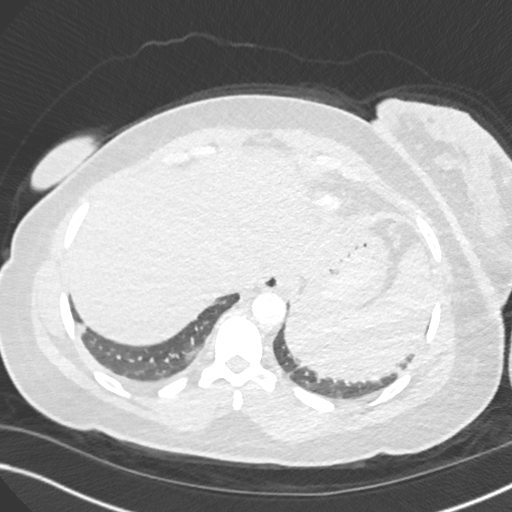
[im 125/417  mediastinal]
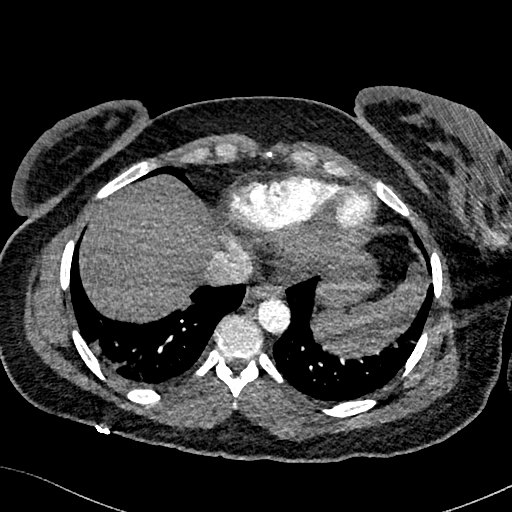
[im 146/417  lung]
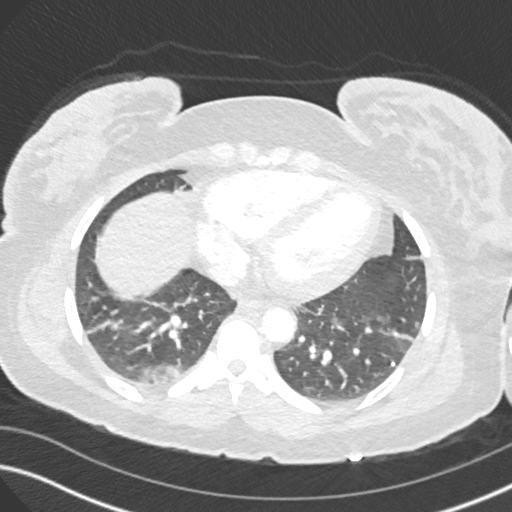
[im 167/417  mediastinal]
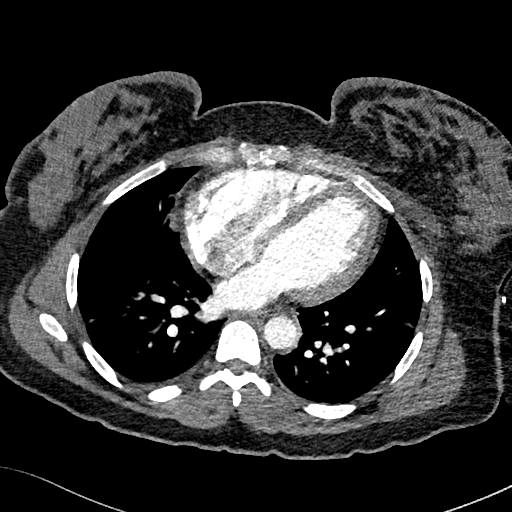
[im 188/417  lung]
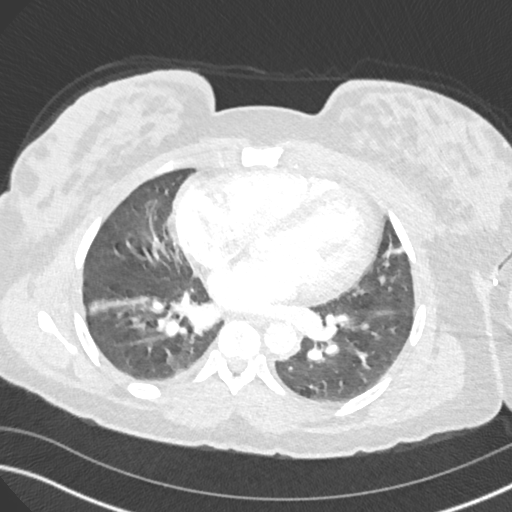
[im 229/417  mediastinal]
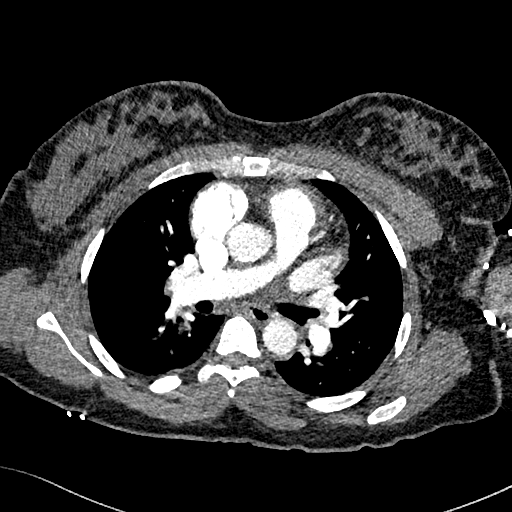
[im 250/417  lung]
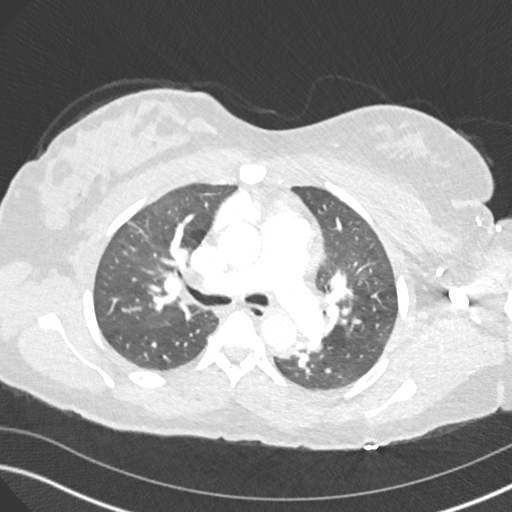
[im 271/417  mediastinal]
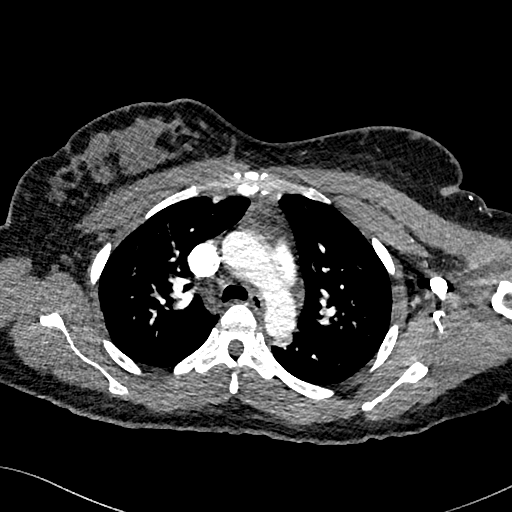
[im 292/417  lung]
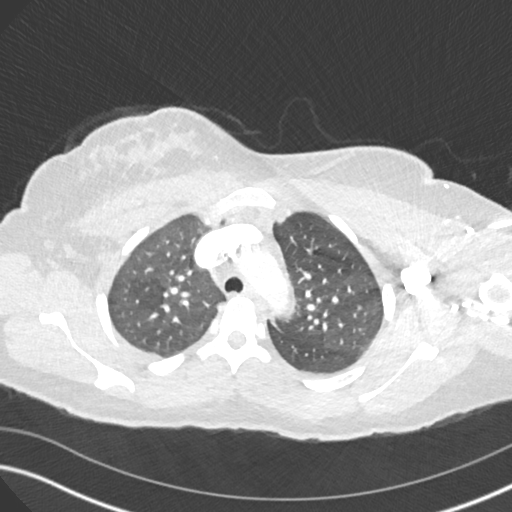
[im 313/417  mediastinal]
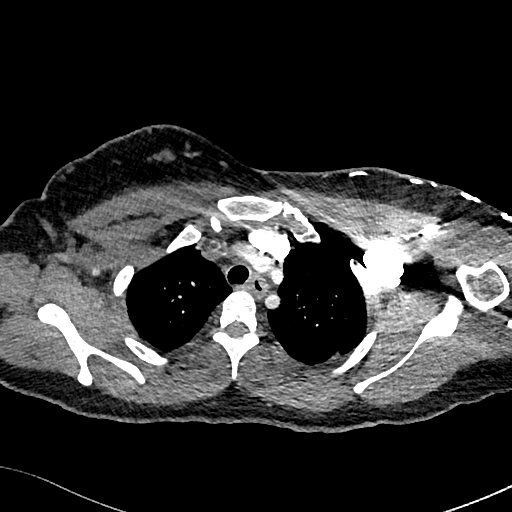
[im 333/417  lung]
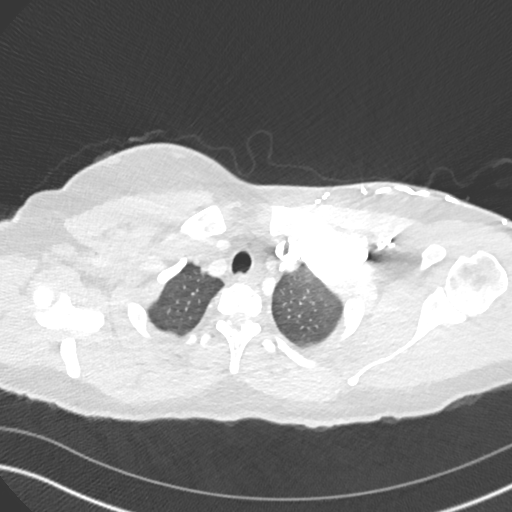
[im 354/417  mediastinal]
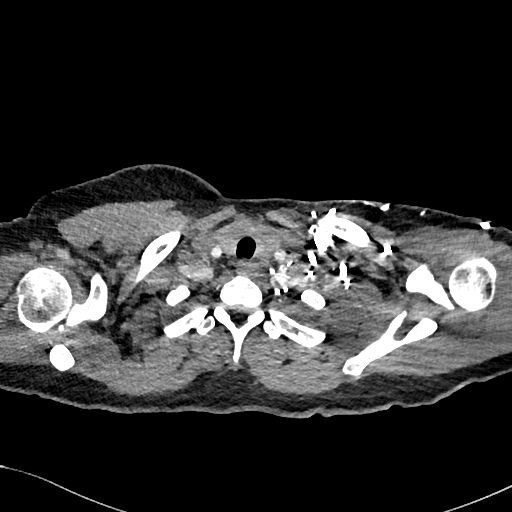
[im 375/417  lung]
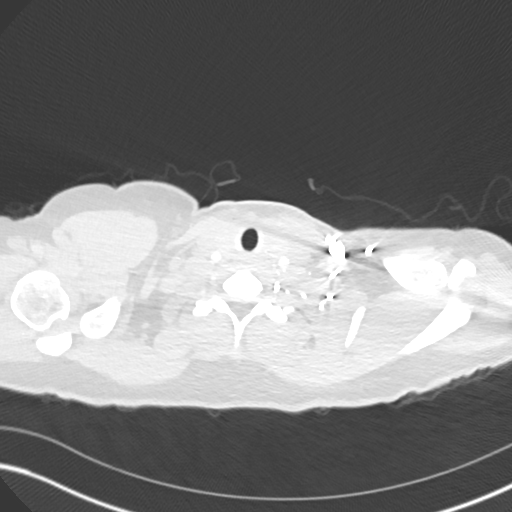
[im 396/417  mediastinal]
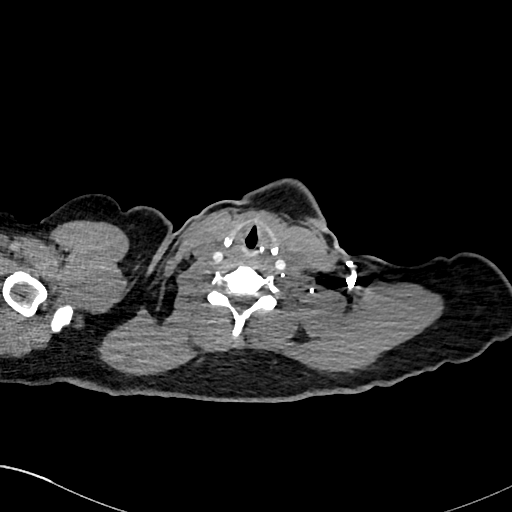

[Series 8: pe 2mm cor · coronal · 0.59mm/px · 1 of 151 slices shown]
[im 76/151  mediastinal]
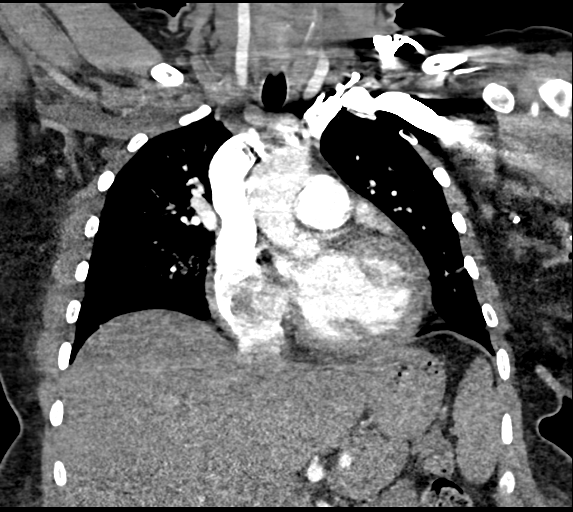

[19 of 36 positions shown; findings below may reference images not displayed]

FINDINGS: Cardiovascular: No pulmonary embolus is identified. No aneurysm.
Heart size is upper normal. No pericardial effusion.

Mediastinum/Nodes: No enlarged mediastinal, hilar, or axillary lymph
nodes. Thyroid gland, trachea, and esophagus demonstrate no
significant findings.

Lungs/Pleura: No pleural effusion. There is mild dependent
atelectasis. Lungs otherwise clear.

Upper Abdomen: Negative.

Musculoskeletal: Negative.

Review of the MIP images confirms the above findings.
IMPRESSION: Negative for pulmonary embolus.  Negative chest CT.

## 2022-06-02 ENCOUNTER — Telehealth: Payer: Self-pay | Admitting: Family Medicine

## 2022-06-02 NOTE — Telephone Encounter (Signed)
Pt is asking if Dr Birdie Riddle would take her 3 children and her as patients ? Cecelia Byars explained that Dr Birdie Riddle was not accepting any new patient at this time the patient wanted tus to ask

## 2022-06-02 NOTE — Telephone Encounter (Signed)
Caller name: Reveca Desmarais   On DPR? :yes/no: Yes  Call back number: [phone]  Provider they see: Birdie Riddle   Reason for call: Pt want to know if Dr.Tabori would take her other three kids on as NP.

## 2022-06-05 NOTE — Telephone Encounter (Signed)
Spoke with pt. Pt states she will come in to set where everything up in person this afternoon or sometime this week. NP paperwork ready for mom to pick up.

## 2022-06-05 NOTE — Telephone Encounter (Signed)
Ok to establish 

## 2022-07-04 ENCOUNTER — Other Ambulatory Visit (HOSPITAL_BASED_OUTPATIENT_CLINIC_OR_DEPARTMENT_OTHER): Payer: Self-pay

## 2022-07-04 ENCOUNTER — Other Ambulatory Visit: Payer: Self-pay | Admitting: Family Medicine

## 2022-07-04 MED ORDER — PANTOPRAZOLE SODIUM 40 MG PO TBEC
40.0000 mg | DELAYED_RELEASE_TABLET | Freq: Every day | ORAL | 1 refills | Status: DC
  Filled 2022-07-04: qty 90, 90d supply, fill #0
  Filled 2023-03-19: qty 90, 90d supply, fill #1
  Filled 2023-03-28: qty 90, 90d supply, fill #0
  Filled 2023-03-28: qty 90, 90d supply, fill #1

## 2022-07-17 ENCOUNTER — Encounter: Payer: Self-pay | Admitting: Family Medicine

## 2022-07-17 ENCOUNTER — Ambulatory Visit: Payer: Medicaid Other | Admitting: Family Medicine

## 2022-07-17 VITALS — BP 128/70 | HR 88 | Temp 97.8°F | Ht 63.5 in | Wt 169.5 lb

## 2022-07-17 DIAGNOSIS — R638 Other symptoms and signs concerning food and fluid intake: Secondary | ICD-10-CM

## 2022-07-17 DIAGNOSIS — E663 Overweight: Secondary | ICD-10-CM | POA: Diagnosis not present

## 2022-07-17 DIAGNOSIS — Z23 Encounter for immunization: Secondary | ICD-10-CM

## 2022-07-17 LAB — LIPID PANEL
Cholesterol: 238 mg/dL — ABNORMAL HIGH (ref 0–200)
HDL: 106.8 mg/dL (ref >39.00)
LDL Cholesterol: 119 mg/dL — ABNORMAL HIGH (ref 0–99)
NonHDL: 131.12
Total CHOL/HDL Ratio: 2
Triglycerides: 61 mg/dL (ref 0.0–149.0)
VLDL: 12.2 mg/dL (ref 0.0–40.0)

## 2022-07-17 LAB — T4, FREE: Free T4: 0.8 ng/dL (ref 0.60–1.60)

## 2022-07-17 LAB — CBC WITH DIFFERENTIAL/PLATELET
Basophils Absolute: 0 10*3/uL (ref 0.0–0.1)
Basophils Relative: 0.4 % (ref 0.0–3.0)
Eosinophils Absolute: 0.1 10*3/uL (ref 0.0–0.7)
Eosinophils Relative: 0.8 % (ref 0.0–5.0)
HCT: 38.5 % (ref 36.0–46.0)
Hemoglobin: 12.7 g/dL (ref 12.0–15.0)
Lymphocytes Relative: 23.6 % (ref 12.0–46.0)
Lymphs Abs: 1.5 10*3/uL (ref 0.7–4.0)
MCHC: 32.9 g/dL (ref 30.0–36.0)
MCV: 80.2 fl (ref 78.0–100.0)
Monocytes Absolute: 0.4 10*3/uL (ref 0.1–1.0)
Monocytes Relative: 5.8 % (ref 3.0–12.0)
Neutro Abs: 4.6 10*3/uL (ref 1.4–7.7)
Neutrophils Relative %: 69.4 % (ref 43.0–77.0)
Platelets: 344 10*3/uL (ref 150.0–400.0)
RBC: 4.8 Mil/uL (ref 3.87–5.11)
RDW: 15.5 % (ref 11.5–15.5)
WBC: 6.6 10*3/uL (ref 4.0–10.5)

## 2022-07-17 LAB — BASIC METABOLIC PANEL
BUN: 15 mg/dL (ref 6–23)
CO2: 26 mEq/L (ref 19–32)
Calcium: 9.5 mg/dL (ref 8.4–10.5)
Chloride: 104 mEq/L (ref 96–112)
Creatinine, Ser: 1.05 mg/dL (ref 0.40–1.20)
GFR: 66.25 mL/min (ref >60.00)
Glucose, Bld: 78 mg/dL (ref 70–99)
Potassium: 3.5 mEq/L (ref 3.5–5.1)
Sodium: 138 mEq/L (ref 135–145)

## 2022-07-17 LAB — HEPATIC FUNCTION PANEL
ALT: 15 U/L (ref 0–35)
AST: 20 U/L (ref 0–37)
Albumin: 4.7 g/dL (ref 3.5–5.2)
Alkaline Phosphatase: 77 U/L (ref 39–117)
Bilirubin, Direct: 0 mg/dL (ref 0.0–0.3)
Total Bilirubin: 0.3 mg/dL (ref 0.2–1.2)
Total Protein: 7.7 g/dL (ref 6.0–8.3)

## 2022-07-17 LAB — TSH: TSH: 1.16 u[IU]/mL (ref 0.35–5.50)

## 2022-07-17 LAB — CORTISOL: Cortisol, Plasma: 6.8 ug/dL

## 2022-07-17 LAB — T3, FREE: T3, Free: 6.1 pg/mL — ABNORMAL HIGH (ref 2.3–4.2)

## 2022-07-17 NOTE — Progress Notes (Signed)
   Subjective:    Patient ID: Jennifer Summers, female    DOB: November 27, 1980, 41 y.o.   MRN: [id number]  HPI Overweight- pt is frustrated by inability to lose weight after last baby.  She is down 8 lbs since last visit.  'i feel like my body is not working'.  Pt has done fasting, has cut carbs, has cut out soda.  Is exercising daily for 30 minutes.  Is getting 15-20,000 steps.  Is worried that this is hormonal.  Pt was previously on Phentermine and it initially helped but then plateau'ed. Has been under high levels of stress.    Review of Systems For ROS see HPI     Objective:   Physical Exam Vitals reviewed.  Constitutional:      General: She is not in acute distress.    Appearance: Normal appearance. She is well-developed. She is not ill-appearing.  HENT:     Head: Normocephalic and atraumatic.  Eyes:     Conjunctiva/sclera: Conjunctivae normal.     Pupils: Pupils are equal, round, and reactive to light.  Neck:     Thyroid: No thyromegaly.  Cardiovascular:     Rate and Rhythm: Normal rate and regular rhythm.     Heart sounds: Normal heart sounds. No murmur heard. Pulmonary:     Effort: Pulmonary effort is normal. No respiratory distress.     Breath sounds: Normal breath sounds.  Abdominal:     General: There is no distension.     Palpations: Abdomen is soft.     Tenderness: There is no abdominal tenderness.  Musculoskeletal:     Cervical back: Normal range of motion and neck supple.     Right lower leg: No edema.     Left lower leg: No edema.  Lymphadenopathy:     Cervical: No cervical adenopathy.  Skin:    General: Skin is warm and dry.  Neurological:     General: No focal deficit present.     Mental Status: She is alert and oriented to person, place, and time.  Psychiatric:        Behavior: Behavior normal.           Assessment & Plan:

## 2022-07-17 NOTE — Patient Instructions (Signed)
We'll decide on follow up based on the lab results If the hormones are crazy, we'll refer to Endo to try and get things back on track Continue to work on healthy diet and regular exercise- you're doing great!! DOWN 8 LBS!!! Call with any questions or concerns Stay Safe!  Stay Healthy! Happy Early Rudene Anda!!

## 2022-07-18 ENCOUNTER — Other Ambulatory Visit: Payer: Self-pay

## 2022-07-18 ENCOUNTER — Telehealth: Payer: Self-pay

## 2022-07-18 DIAGNOSIS — R7989 Other specified abnormal findings of blood chemistry: Secondary | ICD-10-CM

## 2022-07-18 NOTE — Telephone Encounter (Signed)
Pt is agreeable notes she does not currently have GYN left the last one she had been seeing should refer her to GYN as well?

## 2022-07-18 NOTE — Addendum Note (Signed)
Addended by: Patrcia Dolly on: 07/18/2022 12:34 PM   Modules accepted: Orders

## 2022-07-18 NOTE — Telephone Encounter (Signed)
-----   Message from Midge Minium, MD sent at 07/18/2022  7:37 AM EST ----- Your hormones do seem out of alignment.  Your free T3 is elevated but your TSH and free T4 are normal.  We will refer to Endocrinology (Dx-high free T3) for complete evaluation and management  Also, your progesterone and estrogen levels are low.  This is definitely something to discuss with both Endocrinology AND Gyn.  If you tell me your GYN, we will send them a copy of these labs.  Remainder of labs look good!

## 2022-07-18 NOTE — Telephone Encounter (Signed)
Patient called back to speak with Diamond. 

## 2022-07-18 NOTE — Telephone Encounter (Signed)
Left pt a vm to cal office in regards to lab results send referral to Endocrinology

## 2022-07-21 LAB — TESTOSTERONE, FREE & TOTAL
Free Testosterone: 2.6 pg/mL (ref 0.1–6.4)
Testosterone, Total, LC-MS-MS: 34 ng/dL (ref 2–45)

## 2022-07-21 LAB — ESTRADIOL: Estradiol: 32 pg/mL

## 2022-07-21 LAB — PROGESTERONE: Progesterone: <0.5 ng/mL

## 2022-07-21 LAB — DHEA-SULFATE: DHEA-SO4: 122 ug/dL (ref 19–237)

## 2022-07-24 ENCOUNTER — Ambulatory Visit: Payer: Medicaid Other | Admitting: Radiology

## 2022-07-24 ENCOUNTER — Encounter: Payer: Self-pay | Admitting: Radiology

## 2022-07-24 VITALS — BP 124/80 | Ht 63.0 in | Wt 171.0 lb

## 2022-07-24 DIAGNOSIS — R7989 Other specified abnormal findings of blood chemistry: Secondary | ICD-10-CM | POA: Diagnosis not present

## 2022-07-24 DIAGNOSIS — R638 Other symptoms and signs concerning food and fluid intake: Secondary | ICD-10-CM | POA: Insufficient documentation

## 2022-07-24 MED ORDER — SLYND 4 MG PO TABS: 1.0000 | ORAL_TABLET | Freq: Every day | ORAL | 0 refills | Status: DC

## 2022-07-24 NOTE — Assessment & Plan Note (Signed)
New.  Pt reports she has struggled for the past year in her attempts to lose the baby weight.  And while she is down 8 lbs since last visit, she feels like 'my body is not working'.  Has tried fasting, cutting carbs, has stopped drinking soda.  Is exercising daily for at least 30 minutes and getting 15-20,000 steps daily.  Pt does acknowledge high stress levels.  Will check labs to risk stratify and to assess for possible underlying issues that are preventing weight loss.  Pt expressed understanding and is in agreement w/ plan.

## 2022-07-24 NOTE — Progress Notes (Signed)
   Ezra Marquess January 19, 1981 [id number]   History:  41 y.o. referred by PCP for low serum progesterone. She was found to have a chemical pregnancy last year. 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 She does not desire a future pregnancy.   Gynecologic History Patient's last menstrual period was 07/08/2022. Period Pattern: (!) Irregular Menstrual Flow: Moderate Menstrual Control: Tampon Dysmenorrhea: (!) Mild Dysmenorrhea Symptoms: Cramping Contraception/Family planning: none Sexually active: yes   Obstetric History OB History  Gravida Para Term Preterm AB Living  '6 5 1 1 '$ 0 5  SAB IAB Ectopic Multiple Live Births  0     0 5    # Outcome Date GA Lbr Len/2nd Weight Sex Delivery Anes PTL Lv  6 Term 09/28/20 [redacted]w[redacted]d 6 lb 0.7 oz (2.74 kg) F CS-Vac Spinal, EPI  LIV  5 Gravida     F CS-LVertical     4 Para     F CS-LVertical     3 Para     F CS-LVertical     2 Preterm  280w0d  CS-LVertical   FD     Complications: Antepartum placental abruption  1 Para     F Vag-Spont        The following portions of the patient's history were reviewed and updated as appropriate: allergies, current medications, past family history, past medical history, past social history, past surgical history, and problem list.  Review of Systems Pertinent items noted in HPI and remainder of comprehensive ROS otherwise negative.   Past medical history, past surgical history, family history and social history were all reviewed and documented in the EPIC chart.   Exam:  Vitals:   07/24/22 1438  BP: 124/80  Weight: 171 lb (77.6 kg)  Height: '5\' 3"'$  (1.6 m)   Body mass index is 30.29 kg/m.  Physical Exam Constitutional:      Appearance: She is normal weight.  Cardiovascular:     Rate and Rhythm: Normal rate and regular rhythm.  Abdominal:     General: Abdomen is flat.     Palpations: Abdomen is soft.  Neurological:     Mental Status: She is alert.  Psychiatric:         Mood and Affect: Mood normal.        Thought Content: Thought content normal.        Judgment: Judgment normal.      Assessment/Plan:   1. Low serum progesterone Does not desire pregnancy Also offered IUD Not a candidate for estrogen containing methods due to Protein S deficiency - Drospirenone (SLYND) 4 MG TABS; Take 1 tablet by mouth daily.  Dispense: 84 tablet; Refill: 0     Follow up 3 months for AEX and med f/u  Treyvin Glidden B WHNP-BC 3:00 PM 07/24/2022

## 2022-07-28 ENCOUNTER — Ambulatory Visit (INDEPENDENT_AMBULATORY_CARE_PROVIDER_SITE_OTHER): Payer: Medicaid Other | Admitting: Family Medicine

## 2022-07-28 ENCOUNTER — Encounter: Payer: Self-pay | Admitting: Family Medicine

## 2022-07-28 VITALS — BP 120/78 | HR 76 | Temp 99.5°F | Resp 17 | Ht 63.0 in | Wt 168.2 lb

## 2022-07-28 DIAGNOSIS — D492 Neoplasm of unspecified behavior of bone, soft tissue, and skin: Secondary | ICD-10-CM | POA: Diagnosis not present

## 2022-07-28 DIAGNOSIS — R21 Rash and other nonspecific skin eruption: Secondary | ICD-10-CM | POA: Diagnosis not present

## 2022-07-28 DIAGNOSIS — Z0000 Encounter for general adult medical examination without abnormal findings: Secondary | ICD-10-CM | POA: Diagnosis not present

## 2022-07-28 LAB — SEDIMENTATION RATE: Sed Rate: 22 mm/hr — ABNORMAL HIGH (ref 0–20)

## 2022-07-28 NOTE — Assessment & Plan Note (Signed)
Pt's PE WNL w/ exception of BMI.  Has GYN exam scheduled for spring.  UTD on Tdap.  Reviewed recent labs.  Will add ANA and ESR due to report of recent malar rash.  Anticipatory guidance provided.

## 2022-07-28 NOTE — Progress Notes (Signed)
   Subjective:    Patient ID: Jennifer Summers, female    DOB: 07-03-1981, 41 y.o.   MRN: [id number]  HPI CPE- scheduled for pap and mammo in March.  Utd on Tdap.  Patient Care Team    Relationship Specialty Notifications Start End  Midge Minium, MD PCP - General Family Medicine  10/03/20      Health Maintenance  Topic Date Due   PAP SMEAR-Modifier  Never done   COVID-19 Vaccine (3 - 2023-24 season) 08/02/2022 (Originally 04/14/2022)   Hepatitis C Screening  07/18/2023 (Originally 07/29/1999)   DTaP/Tdap/Td (3 - Td or Tdap) 07/12/2030   INFLUENZA VACCINE  Completed   HIV Screening  Completed   HPV VACCINES  Aged Out     Review of Systems Patient reports no vision changes, adenopathy,fever, weight change,  persistant/recurrent hoarseness , swallowing issues, chest pain, palpitations, edema, persistant/recurrent cough, hemoptysis, dyspnea (rest/exertional/paroxysmal nocturnal), gastrointestinal bleeding (melena, rectal bleeding), abdominal pain, significant heartburn, bowel changes, GU symptoms (dysuria, hematuria, incontinence), Gyn symptoms (abnormal  bleeding, pain),  syncope, focal weakness, memory loss, numbness & tingling, hair/nail changes, abnormal bruising or bleeding, anxiety, or depression.   + malar rash in November + hearing loss L ear    Objective:   Physical Exam General Appearance:    Alert, cooperative, no distress, appears stated age  Head:    Normocephalic, without obvious abnormality, atraumatic  Eyes:    PERRL, conjunctiva/corneas clear, EOM's intact both eyes  Ears:    Normal TM's both ears, L EAC w/ tender growth  Nose:   Nares normal, septum midline, mucosa normal, no drainage    or sinus tenderness  Throat:   Lips, mucosa, and tongue normal; teeth and gums normal  Neck:   Supple, symmetrical, trachea midline, no adenopathy;    Thyroid: no enlargement/tenderness/nodules  Back:     Symmetric, no curvature, ROM normal, no CVA tenderness  Lungs:      Clear to auscultation bilaterally, respirations unlabored  Chest Wall:    No tenderness or deformity   Heart:    Regular rate and rhythm, S1 and S2 normal, no murmur, rub   or gallop  Breast Exam:    Deferred to GYN  Abdomen:     Soft, non-tender, bowel sounds active all four quadrants,    no masses, no organomegaly  Genitalia:    Deferred to GYN  Rectal:    Extremities:   Extremities normal, atraumatic, no cyanosis or edema  Pulses:   2+ and symmetric all extremities  Skin:   Skin color, texture, turgor normal, no rashes or lesions  Lymph nodes:   Cervical, supraclavicular, and axillary nodes normal  Neurologic:   CNII-XII intact, normal strength, sensation and reflexes    throughout          Assessment & Plan:

## 2022-07-28 NOTE — Patient Instructions (Signed)
Follow up in 1 year or as needed We'll notify you of your lab results and make any changes if needed Keep up the good work on healthy diet and regular exercise- you're doing great! If the ear doesn't flush, we'll send you to ENT for a complete evaluation Call with any questions or concerns Stay Safe!  Stay Healthy Happy Holidays!!!

## 2022-07-30 LAB — ANA: Anti Nuclear Antibody (ANA): NEGATIVE

## 2022-07-31 ENCOUNTER — Telehealth: Payer: Self-pay

## 2022-07-31 NOTE — Telephone Encounter (Signed)
-----   Message from Midge Minium, MD sent at 07/30/2022  8:05 PM EST ----- Thankfully no evidence of autoimmune process.  Very slight elevation in Sed Rate but this can be due to a viral illness or other condition and is not something to worry about

## 2022-07-31 NOTE — Telephone Encounter (Signed)
Informed pt of lab results  

## 2022-08-01 ENCOUNTER — Encounter: Payer: Self-pay | Admitting: Family Medicine

## 2022-08-01 ENCOUNTER — Telehealth: Payer: Self-pay

## 2022-08-01 MED ORDER — PHENTERMINE HCL 37.5 MG PO TABS: 37.5000 mg | ORAL_TABLET | Freq: Every day | ORAL | 0 refills | Status: DC

## 2022-08-01 MED ORDER — PHENTERMINE HCL 37.5 MG PO CAPS: 37.5000 mg | ORAL_CAPSULE | ORAL | 0 refills | Status: DC

## 2022-08-01 NOTE — Telephone Encounter (Signed)
Informed pt of the medication change to capsules she expressed verbal understanding

## 2022-08-01 NOTE — Telephone Encounter (Signed)
Pharmacy sent a request stating the pt Rx phentermine 37.5 mg is not covered it needs an alternative . The Alternative they suggest is Phentermine Cap

## 2022-08-01 NOTE — Telephone Encounter (Signed)
Prescription changed to reflect capsules

## 2022-08-02 ENCOUNTER — Other Ambulatory Visit: Payer: Medicaid Other

## 2022-08-02 ENCOUNTER — Telehealth: Payer: Self-pay | Admitting: *Deleted

## 2022-08-02 DIAGNOSIS — Z3201 Encounter for pregnancy test, result positive: Secondary | ICD-10-CM

## 2022-08-02 NOTE — Telephone Encounter (Signed)
Please bring in for lab appt for hcg quant and progesterone level

## 2022-08-02 NOTE — Telephone Encounter (Signed)
Orders placed patient coming today.

## 2022-08-02 NOTE — Telephone Encounter (Signed)
Patient called was prescribed Slynd 4 mg tablet on 07/24/22, didn't start Rx yet, waiting on cycle to start. Patient said this am she took a UPT and it was positive, history of low serum progesterone. She asked what to do? Please advise

## 2022-08-02 NOTE — Addendum Note (Signed)
Addended by: Midge Minium on: 08/02/2022 02:01 PM   Modules accepted: Orders

## 2022-08-03 ENCOUNTER — Encounter: Payer: Self-pay | Admitting: *Deleted

## 2022-08-03 LAB — PROGESTERONE: Progesterone: <0.5 ng/mL

## 2022-08-03 LAB — HCG, QUANTITATIVE, PREGNANCY: HCG, Total, QN: <5 m[IU]/mL

## 2022-08-24 ENCOUNTER — Ambulatory Visit: Payer: Medicaid Other | Admitting: Radiology

## 2022-08-24 ENCOUNTER — Other Ambulatory Visit: Payer: Self-pay | Admitting: Otolaryngology

## 2022-08-24 VITALS — BP 118/72

## 2022-08-24 DIAGNOSIS — R7989 Other specified abnormal findings of blood chemistry: Secondary | ICD-10-CM | POA: Diagnosis not present

## 2022-08-24 DIAGNOSIS — H938X2 Other specified disorders of left ear: Secondary | ICD-10-CM | POA: Diagnosis not present

## 2022-08-24 MED ORDER — NORETHINDRONE 0.35 MG PO TABS: 1.0000 | ORAL_TABLET | Freq: Every day | ORAL | 0 refills | Status: DC

## 2022-08-24 NOTE — Progress Notes (Signed)
   Jennifer Summers October 25, 1980 [id number]   History: 42 y.o. referred by PCP for low serum progesterone last months. She was found to have a chemical pregnancy last year. 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 She does not desire a future pregnancy.  She was started on [facility] after last visit but reports it caused irritability and anxiety so she stopped it. Here to discuss other options.  Gynecologic History Patient's last menstrual period was 08/23/2022 (exact date).  Obstetric History OB History  Gravida Para Term Preterm AB Living  '6 5 1 1 '$ 0 5  SAB IAB Ectopic Multiple Live Births  0     0 5    # Outcome Date GA Lbr Len/2nd Weight Sex Delivery Anes PTL Lv  6 Term 09/28/20 [redacted]w[redacted]d 6 lb 0.7 oz (2.74 kg) F CS-Vac Spinal, EPI  LIV  5 Gravida     F CS-LVertical     4 Para     F CS-LVertical     3 Para     F CS-LVertical     2 Preterm  275w0d  CS-LVertical   FD     Complications: Antepartum placental abruption  1 Para     F Vag-Spont        The following portions of the patient's history were reviewed and updated as appropriate: allergies, current medications, past family history, past medical history, past social history, past surgical history, and problem list.  Review of Systems Pertinent items noted in HPI and remainder of comprehensive ROS otherwise negative.    Past medical history, past surgical history, family history and social history were all reviewed and documented in the EPIC chart.  ROS:  A ROS was performed and pertinent positives and negatives are included.  Exam:  Vitals:   08/24/22 0900  BP: 118/72   There is no height or weight on file to calculate BMI.   Assessment/Plan: 1. Low serum progesterone Will switch to micronor to help with symptoms Take at the same time every day - norethindrone (MICRONOR) 0.35 MG tablet; Take 1 tablet (0.35 mg total) by mouth daily.  Dispense: 84 tablet; Refill: 0   Jennifer Summers, WHNP-BC

## 2022-08-25 ENCOUNTER — Encounter: Payer: Self-pay | Admitting: Otolaryngology

## 2022-08-25 DIAGNOSIS — R7989 Other specified abnormal findings of blood chemistry: Secondary | ICD-10-CM

## 2022-08-25 MED ORDER — PROGESTERONE MICRONIZED 100 MG PO CAPS: ORAL_CAPSULE | ORAL | 0 refills | Status: DC

## 2022-08-25 NOTE — Telephone Encounter (Signed)
Thank you :)

## 2022-08-25 NOTE — Telephone Encounter (Signed)
FYI. Pt opted for progesterone option. Rx sent for 26mosupply only as POPs were. Advise if anything else to add. Thanks.

## 2022-08-25 NOTE — Telephone Encounter (Signed)
We are using this medication to raise her levels, not for pregnancy prevention. Her other alternative would be micronized progesterone '100mg'$  days 1-12 of cycle

## 2022-09-08 ENCOUNTER — Ambulatory Visit: Payer: Medicaid Other | Admitting: Family Medicine

## 2022-09-18 ENCOUNTER — Encounter: Payer: Self-pay | Admitting: Family Medicine

## 2022-09-19 NOTE — Telephone Encounter (Signed)
Pt would like to see about getting the weight loss med discussed at her physical, she does plan to pay out of pocket, does she need a secondary visit to go through this?   Please advise

## 2022-09-25 ENCOUNTER — Ambulatory Visit
Admission: RE | Admit: 2022-09-25 | Discharge: 2022-09-25 | Disposition: A | Discharge status: Home / Self Care | Payer: Medicaid Other | Source: Ambulatory Visit | Attending: Otolaryngology | Admitting: Otolaryngology

## 2022-09-25 DIAGNOSIS — H938X2 Other specified disorders of left ear: Secondary | ICD-10-CM

## 2022-09-25 DIAGNOSIS — H9192 Unspecified hearing loss, left ear: Secondary | ICD-10-CM | POA: Diagnosis not present

## 2022-10-06 DIAGNOSIS — H61812 Exostosis of left external canal: Secondary | ICD-10-CM | POA: Diagnosis not present

## 2022-10-06 DIAGNOSIS — H938X2 Other specified disorders of left ear: Secondary | ICD-10-CM | POA: Diagnosis not present

## 2022-10-06 DIAGNOSIS — D2321 Other benign neoplasm of skin of right ear and external auricular canal: Secondary | ICD-10-CM | POA: Diagnosis not present

## 2022-10-13 ENCOUNTER — Other Ambulatory Visit: Payer: Self-pay | Admitting: Radiology

## 2022-10-13 DIAGNOSIS — R7989 Other specified abnormal findings of blood chemistry: Secondary | ICD-10-CM

## 2022-10-24 ENCOUNTER — Ambulatory Visit: Payer: Medicaid Other | Admitting: Radiology

## 2022-10-31 ENCOUNTER — Ambulatory Visit: Payer: Medicaid Other | Admitting: Family Medicine

## 2022-10-31 ENCOUNTER — Encounter: Payer: Self-pay | Admitting: Family Medicine

## 2022-10-31 VITALS — BP 126/84 | HR 79 | Temp 97.9°F | Resp 17 | Ht 63.0 in | Wt 171.2 lb

## 2022-10-31 DIAGNOSIS — M5412 Radiculopathy, cervical region: Secondary | ICD-10-CM

## 2022-10-31 MED ORDER — METHOCARBAMOL 500 MG PO TABS: 500.0000 mg | ORAL_TABLET | Freq: Three times a day (TID) | ORAL | 0 refills | Status: DC | PRN

## 2022-10-31 MED ORDER — PREDNISONE 10 MG PO TABS: ORAL_TABLET | ORAL | 0 refills | Status: DC

## 2022-10-31 NOTE — Patient Instructions (Signed)
Follow up as needed or as scheduled Send me a message Friday and let me know how things are going If no better, we'll refer to Sports Med START the Prednisone as directed- 3 pills at the same time x3 days, then 2 pills at the same time x3 days, then 1 pill daily.  Take w/ food  USE the Methocarbamol as needed for spasm HEAT! Call with any questions or concerns Hang in there!!!

## 2022-10-31 NOTE — Progress Notes (Signed)
   Subjective:    Patient ID: Jennifer Summers, female    DOB: [birth year], 42 y.o.   MRN: [id number]  HPI L neck pain- radiates down into upper back, shoulder, and L arm.  Prior xrays showed cervical disc degeneration.  Yesterday L arm started hurting to the point that she can't lift it overhead.  Has been painting her house.  Has some tingling in upper back and fingers 'on and off'.  No relief w/ Aleve or BioFreeze.     Review of Systems For ROS see HPI     Objective:   Physical Exam Vitals reviewed.  Constitutional:      General: She is not in acute distress.    Appearance: Normal appearance.     Comments: Obviously uncomfortable  HENT:     Head: Normocephalic and atraumatic.  Eyes:     Extraocular Movements: Extraocular movements intact.     Pupils: Pupils are equal, round, and reactive to light.  Musculoskeletal:     Comments: Pain w/ forward flexion of L shoulder to 45 degrees, pain w/ abduction at 75 degrees  Neurological:     General: No focal deficit present.     Mental Status: She is alert and oriented to person, place, and time.  Psychiatric:        Mood and Affect: Mood normal.        Behavior: Behavior normal.        Thought Content: Thought content normal.           Assessment & Plan:  Cervical radiculopathy- new to provider.  Reviewed previous xray reports.  Pt w/ limited ROM- likely from painting her kitchen, picking up her toddler, and known cervical DDD.  Start Prednisone taper as pt is not having any relief w/ Aleve.  Start Methocarbamol for spasm prn.  Encouraged heat.  If no improvement by later this week will need Sports Med referral.  Pt expressed understanding and is in agreement w/ plan.

## 2022-11-03 ENCOUNTER — Encounter: Payer: Self-pay | Admitting: Family Medicine

## 2022-11-03 NOTE — Telephone Encounter (Signed)
Pt is asking if she can take aleve in the AM as she is staking prednisone in PM notes she is feeling better but not resolved.

## 2022-11-21 DIAGNOSIS — D164 Benign neoplasm of bones of skull and face: Secondary | ICD-10-CM | POA: Diagnosis not present

## 2022-11-26 ENCOUNTER — Encounter: Payer: Self-pay | Admitting: Family Medicine

## 2022-11-26 DIAGNOSIS — N912 Amenorrhea, unspecified: Secondary | ICD-10-CM

## 2022-11-28 ENCOUNTER — Other Ambulatory Visit (INDEPENDENT_AMBULATORY_CARE_PROVIDER_SITE_OTHER): Payer: Medicaid Other

## 2022-11-28 DIAGNOSIS — Z8639 Personal history of other endocrine, nutritional and metabolic disease: Secondary | ICD-10-CM | POA: Diagnosis not present

## 2022-11-28 DIAGNOSIS — N912 Amenorrhea, unspecified: Secondary | ICD-10-CM | POA: Diagnosis not present

## 2022-11-28 DIAGNOSIS — N96 Recurrent pregnancy loss: Secondary | ICD-10-CM | POA: Diagnosis not present

## 2022-11-28 DIAGNOSIS — R946 Abnormal results of thyroid function studies: Secondary | ICD-10-CM | POA: Diagnosis not present

## 2022-11-28 DIAGNOSIS — R635 Abnormal weight gain: Secondary | ICD-10-CM | POA: Diagnosis not present

## 2022-11-28 LAB — HCG, QUANTITATIVE, PREGNANCY: Quantitative HCG: 3.62 m[IU]/mL

## 2022-12-01 ENCOUNTER — Ambulatory Visit: Payer: Medicaid Other | Admitting: Family Medicine

## 2022-12-01 ENCOUNTER — Encounter: Payer: Self-pay | Admitting: Family Medicine

## 2022-12-01 VITALS — BP 118/62 | HR 98 | Temp 97.8°F | Ht 63.0 in | Wt 165.6 lb

## 2022-12-01 DIAGNOSIS — N912 Amenorrhea, unspecified: Secondary | ICD-10-CM

## 2022-12-01 LAB — LUTEINIZING HORMONE: LH: 54.32 m[IU]/mL

## 2022-12-01 LAB — HCG, QUANTITATIVE, PREGNANCY: Quantitative HCG: 4.24 m[IU]/mL

## 2022-12-01 LAB — FOLLICLE STIMULATING HORMONE: FSH: 60.2 m[IU]/mL

## 2022-12-01 NOTE — Progress Notes (Signed)
   Subjective:    Patient ID: Jennifer Summers, female    DOB: Jun 25, 1981, 42 y.o.   MRN: [id number]  HPI Amenorrhea- LMP 3/7.  Had multiple + home pregnancy tests.  BHCG here was very low.  She thought cycles were back on track but has had irregularity in the past.  Initially had breast tenderness (~10 days ago) but this has resolved.  Feels that body temperature is higher as she is 'extremely hot'.  Pt was on progesterone but stopped ~2 months ago.   Review of Systems For ROS see HPI     Objective:   Physical Exam Vitals reviewed.  Constitutional:      General: She is not in acute distress.    Appearance: Normal appearance. She is not ill-appearing.  HENT:     Head: Normocephalic and atraumatic.  Cardiovascular:     Rate and Rhythm: Normal rate.  Pulmonary:     Effort: Pulmonary effort is normal. No respiratory distress.  Musculoskeletal:     Right lower leg: No edema.     Left lower leg: No edema.  Skin:    General: Skin is warm and dry.  Neurological:     General: No focal deficit present.     Mental Status: She is alert and oriented to person, place, and time.  Psychiatric:        Mood and Affect: Mood normal.        Behavior: Behavior normal.        Thought Content: Thought content normal.           Assessment & Plan:  Amenorrhea- pt has had multiple episodes of amenorrhea w/ positive pregnancy tests only to determine she is not pregnant.  She feels 'like I'm broken'.  Did discuss possibility of perimenopause.  Will check hormones to determine pregnancy or if there is an imbalance.  Pt expressed understanding and is in agreement w/ plan.

## 2022-12-01 NOTE — Telephone Encounter (Signed)
Apologies, per mychart msg from 08/25/2022. Pt did not want to take BCPs. So, she opted for micronized progesterone on days 1-12 of cycle.

## 2022-12-01 NOTE — Patient Instructions (Signed)
Follow up as needed or as scheduled We'll notify you of your lab results and make any changes if needed Call with any questions or concerns Hang in there!  We'll try and figure this out!

## 2022-12-01 NOTE — Telephone Encounter (Signed)
Ball shaped? They should be tiny little flat pills, is she talking about the micronor for sure?

## 2022-12-01 NOTE — Telephone Encounter (Signed)
She can use the same medication vaginally at bedtime.

## 2022-12-02 LAB — PROGESTERONE: Progesterone: <0.5 ng/mL

## 2022-12-04 ENCOUNTER — Telehealth: Payer: Self-pay | Admitting: Family Medicine

## 2022-12-04 ENCOUNTER — Telehealth: Payer: Self-pay

## 2022-12-04 DIAGNOSIS — R635 Abnormal weight gain: Secondary | ICD-10-CM | POA: Diagnosis not present

## 2022-12-04 DIAGNOSIS — E663 Overweight: Secondary | ICD-10-CM | POA: Diagnosis not present

## 2022-12-04 NOTE — Telephone Encounter (Signed)
Pt seen results Via my chart  

## 2022-12-04 NOTE — Telephone Encounter (Signed)
-----   Message from Sheliah Hatch, MD sent at 12/02/2022  2:24 PM EDT ----- You are not pregnant but your labs indicate that you may be in menopause.  That would explain the irregular or lack of periods

## 2022-12-19 DIAGNOSIS — Z8639 Personal history of other endocrine, nutritional and metabolic disease: Secondary | ICD-10-CM | POA: Diagnosis not present

## 2022-12-19 DIAGNOSIS — N96 Recurrent pregnancy loss: Secondary | ICD-10-CM | POA: Diagnosis not present

## 2022-12-19 DIAGNOSIS — N951 Menopausal and female climacteric states: Secondary | ICD-10-CM | POA: Diagnosis not present

## 2022-12-19 DIAGNOSIS — E663 Overweight: Secondary | ICD-10-CM | POA: Diagnosis not present

## 2023-01-01 ENCOUNTER — Ambulatory Visit: Payer: Medicaid Other | Admitting: Family Medicine

## 2023-01-15 ENCOUNTER — Ambulatory Visit: Payer: Medicaid Other | Admitting: Family Medicine

## 2023-01-15 ENCOUNTER — Encounter: Payer: Self-pay | Admitting: Family Medicine

## 2023-01-15 VITALS — BP 124/84 | HR 96 | Temp 97.8°F | Resp 17 | Ht 63.0 in | Wt 152.5 lb

## 2023-01-15 DIAGNOSIS — F419 Anxiety disorder, unspecified: Secondary | ICD-10-CM | POA: Diagnosis not present

## 2023-01-15 DIAGNOSIS — F32A Depression, unspecified: Secondary | ICD-10-CM

## 2023-01-15 MED ORDER — BUPROPION HCL 75 MG PO TABS: 75.0000 mg | ORAL_TABLET | Freq: Two times a day (BID) | ORAL | 3 refills | Status: DC

## 2023-01-15 NOTE — Assessment & Plan Note (Signed)
Deteriorated.  Pt is feeling very overwhelmed and that she can't keep 'anything straight'.  Will start tasks and then get distracted.  Can't follow her own thought process.  Is irritable, emotional, and distracted.  Has 5 daughters, oldest just moved back home yesterday and is pregnant (due in Sept).  Pt has been on medications in the past for depression/anxiety but some have had sexual side effects, some she had to change due to pregnancy.  Will start Wellbutrin 75mg  BID as this is used for depression, anxiety, and inattention.  Will follow closely.  Pt expressed understanding and is in agreement w/ plan.

## 2023-01-15 NOTE — Patient Instructions (Signed)
Follow up in 4 weeks to recheck mood START the Wellbutrin twice daily to help w/ anxiety It's ok to delegate and ask for help!!! Call with any questions or concerns Hang in there!!  You've got this!!

## 2023-01-15 NOTE — Progress Notes (Signed)
   Subjective:    Patient ID: Jennifer Summers, female    DOB: April 23, 1981, 42 y.o.   MRN: [id number]  HPI Anxiety- pt reports feeling on edge, feels heart is beating hard and fast.  Feeling overwhelmed.  Having a hard time staying on track, 'i'm everywhere'.  Had a 'breakdown' in [employer].  'i was just done'.  Has previously been on meds for anxiety/depression.  Oldest daughter (58) just moved home yesterday- is pregnant.     Review of Systems For ROS see HPI     Objective:   Physical Exam Vitals reviewed.  Constitutional:      General: She is not in acute distress.    Appearance: Normal appearance. She is not ill-appearing.  HENT:     Head: Normocephalic and atraumatic.  Cardiovascular:     Rate and Rhythm: Normal rate and regular rhythm.  Pulmonary:     Effort: Pulmonary effort is normal. No respiratory distress.  Skin:    General: Skin is warm and dry.  Neurological:     General: No focal deficit present.     Mental Status: She is alert and oriented to person, place, and time.  Psychiatric:        Behavior: Behavior normal.        Thought Content: Thought content normal.     Comments: Tearful, anxious           Assessment & Plan:

## 2023-01-30 ENCOUNTER — Encounter: Payer: Self-pay | Admitting: Family Medicine

## 2023-01-30 ENCOUNTER — Ambulatory Visit: Payer: Medicaid Other | Admitting: Family Medicine

## 2023-01-30 VITALS — BP 116/70 | HR 87 | Temp 97.9°F | Resp 18 | Ht 63.0 in | Wt 149.0 lb

## 2023-01-30 DIAGNOSIS — R21 Rash and other nonspecific skin eruption: Secondary | ICD-10-CM | POA: Diagnosis not present

## 2023-01-30 MED ORDER — TRIAMCINOLONE ACETONIDE 0.1 % EX OINT: 1.0000 | TOPICAL_OINTMENT | Freq: Two times a day (BID) | CUTANEOUS | 1 refills | Status: DC

## 2023-01-30 NOTE — Progress Notes (Signed)
   Subjective:    Patient ID: Jennifer Summers, female    DOB: 02-25-1981, 42 y.o.   MRN: [id number]  HPI Rash- sxs started ~3.5 weeks ago.  Itchy.  Will use hydrocortisone w/o relief.  Area will get red w/ scratching.  No relief w/ Mometasone cream.  L arm w/ small patch on R arm.  No hx of similar.  No changes in detergents, soaps, lotions.  No weeping or oozing.     Review of Systems For ROS see HPI     Objective:   Physical Exam Vitals reviewed.  Constitutional:      General: She is not in acute distress.    Appearance: Normal appearance. She is not ill-appearing.  HENT:     Head: Normocephalic and atraumatic.  Skin:    General: Skin is warm and dry.     Findings: Rash (hyperpigmented maculopapular rash on L forearm consistent w/ eczema.  small similar area on R elbow at flexor crease) present.  Neurological:     General: No focal deficit present.     Mental Status: She is alert and oriented to person, place, and time.  Psychiatric:        Mood and Affect: Mood normal.        Behavior: Behavior normal.        Thought Content: Thought content normal.           Assessment & Plan:  Rash- new.  Consistent w/ eczema.  Will start topical Triamcinolone ointment BID.  Reviewed dx and supportive measures.  Pt expressed understanding and is in agreement w/ plan.

## 2023-01-30 NOTE — Patient Instructions (Signed)
Follow up as needed or as scheduled START the Triamcinolone ointment twice daily Use Claritin or Zyrtec to help w/ itching Call with any questions or concerns Hang in there!!!

## 2023-02-12 ENCOUNTER — Ambulatory Visit: Payer: Medicaid Other | Admitting: Family Medicine

## 2023-02-16 ENCOUNTER — Ambulatory Visit: Payer: Medicaid Other | Admitting: Family Medicine

## 2023-02-20 ENCOUNTER — Telehealth: Payer: Self-pay

## 2023-02-20 NOTE — Telephone Encounter (Signed)
Called to discuss Health Maintenance looks like she is due for a PAP and we would like to find out where and apx when this was completed most recently

## 2023-02-28 DIAGNOSIS — H5213 Myopia, bilateral: Secondary | ICD-10-CM | POA: Diagnosis not present

## 2023-03-02 ENCOUNTER — Other Ambulatory Visit: Payer: Self-pay

## 2023-03-02 ENCOUNTER — Emergency Department (HOSPITAL_BASED_OUTPATIENT_CLINIC_OR_DEPARTMENT_OTHER)
Admission: EM | Admit: 2023-03-02 | Discharge: 2023-03-03 | Disposition: A | Discharge status: Home / Self Care | Payer: Medicaid Other | Attending: Emergency Medicine | Admitting: Emergency Medicine

## 2023-03-02 ENCOUNTER — Encounter (HOSPITAL_BASED_OUTPATIENT_CLINIC_OR_DEPARTMENT_OTHER): Payer: Self-pay | Admitting: Emergency Medicine

## 2023-03-02 DIAGNOSIS — Z8616 Personal history of COVID-19: Secondary | ICD-10-CM | POA: Diagnosis not present

## 2023-03-02 DIAGNOSIS — R109 Unspecified abdominal pain: Secondary | ICD-10-CM | POA: Diagnosis present

## 2023-03-02 DIAGNOSIS — N201 Calculus of ureter: Secondary | ICD-10-CM | POA: Diagnosis not present

## 2023-03-02 LAB — URINALYSIS, ROUTINE W REFLEX MICROSCOPIC
Bilirubin Urine: NEGATIVE
Glucose, UA: NEGATIVE mg/dL
Hgb urine dipstick: NEGATIVE
Ketones, ur: NEGATIVE mg/dL
Nitrite: NEGATIVE
Protein, ur: 30 mg/dL — AB
Specific Gravity, Urine: 1.024 (ref 1.005–1.030)
pH: 7.5 (ref 5.0–8.0)

## 2023-03-02 MED ORDER — SODIUM CHLORIDE 0.9 % IV BOLUS (SEPSIS)
1000.0000 mL | Freq: Once | INTRAVENOUS | Status: AC
  Administered 2023-03-03: 1000 mL via INTRAVENOUS

## 2023-03-02 MED ORDER — OXYCODONE-ACETAMINOPHEN 5-325 MG PO TABS
1.0000 | ORAL_TABLET | ORAL | Status: DC | PRN
  Administered 2023-03-02: 1 via ORAL
  Filled 2023-03-02: qty 1

## 2023-03-02 MED ORDER — PROCHLORPERAZINE EDISYLATE 10 MG/2ML IJ SOLN
10.0000 mg | Freq: Once | INTRAMUSCULAR | Status: AC
  Administered 2023-03-03: 10 mg via INTRAVENOUS
  Filled 2023-03-02: qty 2

## 2023-03-02 MED ORDER — SODIUM CHLORIDE 0.9 % IV SOLN
1000.0000 mL | INTRAVENOUS | Status: DC
  Administered 2023-03-03: 1000 mL via INTRAVENOUS

## 2023-03-02 MED ORDER — FENTANYL CITRATE PF 50 MCG/ML IJ SOSY
50.0000 ug | PREFILLED_SYRINGE | Freq: Once | INTRAMUSCULAR | Status: AC
  Administered 2023-03-03: 50 ug via INTRAVENOUS
  Filled 2023-03-02: qty 1

## 2023-03-02 MED ORDER — ONDANSETRON 4 MG PO TBDP
4.0000 mg | ORAL_TABLET | Freq: Once | ORAL | Status: AC
  Administered 2023-03-02: 4 mg via ORAL

## 2023-03-02 NOTE — ED Triage Notes (Addendum)
Pt presents to ED POV. Pt c/o R lower back pain that began suddenly 1h ago. Pain is 10/10, constant, and radiates forward. Denies GU s/s. Reports nausea ~21mins

## 2023-03-02 NOTE — ED Provider Notes (Signed)
[place] EMERGENCY DEPARTMENT AT [facility] Provider Note  CSN: [id number] Arrival date & time: 03/02/23 2138  Chief Complaint(s) Back Pain  HPI Jennifer Summers is a 42 y.o. female {Add pertinent medical, surgical, social history, OB history to HPI:1}    Back Pain   Past Medical History Past Medical History:  Diagnosis Date   Acid reflux    Anxiety    Cervical disc disorder 09/14/2017   Very minimal disc space narrowing C4-5 and C5-6 by x-ray.   IBS (irritable bowel syndrome)    Immunity status testing 04/16/2013   Tested positive for immunity measles/mumps/rubella and varicella.   Iron deficiency anemia    Patient reports having to have iron/blood infusions in the past.   Migraines    Pancreatitis    Placenta previa    Prediabetes 11/08/2018   Protein S deficiency (HCC)    Right upper quadrant abdominal pain 04/17/2017   Vitamin D deficiency    Patient Active Problem List   Diagnosis Date Noted   Physical exam 07/28/2022   Unable to lose weight 07/24/2022   COVID-19 virus infection 10/03/2020   Bilateral lower extremity edema 10/03/2020   Hypokalemia 10/03/2020   H. pylori infection 12/25/2019   Abnormal TSH 12/25/2019   Vitamin D deficiency    Anxiety and depression 08/28/2019   Chronic GERD 08/25/2019   Iron deficiency anemia 06/27/2016   Protein S deficiency (HCC) 11/01/2005   Home Medication(s) Prior to Admission medications   Medication Sig Start Date End Date Taking? Authorizing Provider  buPROPion (WELLBUTRIN) 75 MG tablet Take 1 tablet (75 mg total) by mouth 2 (two) times daily. 01/15/23   Sheliah Hatch, MD  pantoprazole (PROTONIX) 40 MG tablet Take 1 tablet (40 mg total) by mouth daily. 07/04/22   Sheliah Hatch, MD  progesterone (PROMETRIUM) 100 MG capsule Take one capsule po on days 1-12 of cycle. 08/25/22   Chrzanowski, Clearnce Hasten B, NP  propranolol (INDERAL) 10 MG tablet TAKE 1 TABLET (10 MG TOTAL) BY MOUTH 3 (THREE) TIMES DAILY AS  NEEDED. 11/07/21 01/15/23  Sheliah Hatch, MD  triamcinolone ointment (KENALOG) 0.1 % Apply 1 Application topically 2 (two) times daily. 01/30/23 01/30/24  Sheliah Hatch, MD                                                                                                                                    Allergies Codeine  Review of Systems Review of Systems  Musculoskeletal:  Positive for back pain.   As noted in HPI  Physical Exam Vital Signs  I have reviewed the triage vital signs BP (!) 126/90 (BP Location: Right Arm)   Pulse 76   Temp 98.3 F (36.8 C)   Resp 18   SpO2 100%  *** Physical Exam  ED Results and Treatments Labs (all labs ordered are listed, but only abnormal results are displayed) Labs Reviewed  URINALYSIS, ROUTINE W  REFLEX MICROSCOPIC - Abnormal; Notable for the following components:      Result Value   APPearance CLOUDY (*)    Protein, ur 30 (*)    Leukocytes,Ua SMALL (*)    Bacteria, UA MANY (*)    All other components within normal limits                                                                                                                         EKG  EKG Interpretation Date/Time:    Ventricular Rate:    PR Interval:    QRS Duration:    QT Interval:    QTC Calculation:   R Axis:      Text Interpretation:         Radiology No results found.  Medications Ordered in ED Medications  prochlorperazine (COMPAZINE) injection 10 mg (has no administration in time range)  fentaNYL (SUBLIMAZE) injection 50 mcg (has no administration in time range)  sodium chloride 0.9 % bolus 1,000 mL (has no administration in time range)    Followed by  0.9 %  sodium chloride infusion (has no administration in time range)  ondansetron (ZOFRAN-ODT) disintegrating tablet 4 mg (4 mg Oral Given 03/02/23 2158)   Procedures Procedures  (including critical care time) Medical Decision Making / ED Course   Medical Decision Making Amount and/or  Complexity of Data Reviewed Labs: ordered.  Risk Prescription drug management.    ***    Final Clinical Impression(s) / ED Diagnoses Final diagnoses:  None    This chart was dictated using voice recognition software.  Despite best efforts to proofread,  errors can occur which can change the documentation meaning.

## 2023-03-03 ENCOUNTER — Emergency Department (HOSPITAL_BASED_OUTPATIENT_CLINIC_OR_DEPARTMENT_OTHER): Payer: Medicaid Other

## 2023-03-03 LAB — COMPREHENSIVE METABOLIC PANEL
ALT: 14 U/L (ref 0–44)
AST: 15 U/L (ref 15–41)
Albumin: 4.9 g/dL (ref 3.5–5.0)
Alkaline Phosphatase: 57 U/L (ref 38–126)
Anion gap: 8 (ref 5–15)
BUN: 12 mg/dL (ref 6–20)
CO2: 30 mmol/L (ref 22–32)
Calcium: 10 mg/dL (ref 8.9–10.3)
Chloride: 104 mmol/L (ref 98–111)
Creatinine, Ser: 1.37 mg/dL — ABNORMAL HIGH (ref 0.44–1.00)
GFR, Estimated: 50 mL/min — ABNORMAL LOW (ref >60)
Glucose, Bld: 153 mg/dL — ABNORMAL HIGH (ref 70–99)
Potassium: 3.6 mmol/L (ref 3.5–5.1)
Sodium: 142 mmol/L (ref 135–145)
Total Bilirubin: 0.5 mg/dL (ref 0.3–1.2)
Total Protein: 7.9 g/dL (ref 6.5–8.1)

## 2023-03-03 LAB — CBC WITH DIFFERENTIAL/PLATELET
Abs Immature Granulocytes: 0.04 10*3/uL (ref 0.00–0.07)
Basophils Absolute: 0 10*3/uL (ref 0.0–0.1)
Basophils Relative: 0 %
Eosinophils Absolute: 0 10*3/uL (ref 0.0–0.5)
Eosinophils Relative: 0 %
HCT: 41.8 % (ref 36.0–46.0)
Hemoglobin: 13.8 g/dL (ref 12.0–15.0)
Immature Granulocytes: 0 %
Lymphocytes Relative: 8 %
Lymphs Abs: 1 10*3/uL (ref 0.7–4.0)
MCH: 28.2 pg (ref 26.0–34.0)
MCHC: 33 g/dL (ref 30.0–36.0)
MCV: 85.3 fL (ref 80.0–100.0)
Monocytes Absolute: 0.4 10*3/uL (ref 0.1–1.0)
Monocytes Relative: 3 %
Neutro Abs: 10.7 10*3/uL — ABNORMAL HIGH (ref 1.7–7.7)
Neutrophils Relative %: 89 %
Platelets: 309 10*3/uL (ref 150–400)
RBC: 4.9 MIL/uL (ref 3.87–5.11)
RDW: 12.2 % (ref 11.5–15.5)
WBC: 12.1 10*3/uL — ABNORMAL HIGH (ref 4.0–10.5)
nRBC: 0 % (ref 0.0–0.2)

## 2023-03-03 LAB — LIPASE, BLOOD: Lipase: 33 U/L (ref 11–51)

## 2023-03-03 LAB — PREGNANCY, URINE: Preg Test, Ur: NEGATIVE

## 2023-03-03 MED ORDER — TAMSULOSIN HCL 0.4 MG PO CAPS
0.4000 mg | ORAL_CAPSULE | Freq: Once | ORAL | Status: AC
  Administered 2023-03-03: 0.4 mg via ORAL
  Filled 2023-03-03: qty 1

## 2023-03-03 MED ORDER — NAPROXEN 375 MG PO TABS: 375.0000 mg | ORAL_TABLET | Freq: Two times a day (BID) | ORAL | 0 refills | Status: DC

## 2023-03-03 MED ORDER — ONDANSETRON 4 MG PO TBDP: 4.0000 mg | ORAL_TABLET | Freq: Three times a day (TID) | ORAL | 0 refills | Status: AC | PRN

## 2023-03-03 MED ORDER — KETOROLAC TROMETHAMINE 15 MG/ML IJ SOLN
15.0000 mg | Freq: Once | INTRAMUSCULAR | Status: AC
  Administered 2023-03-03: 15 mg via INTRAVENOUS
  Filled 2023-03-03: qty 1

## 2023-03-03 NOTE — ED Notes (Signed)
Patient transported to CT 

## 2023-03-03 NOTE — ED Notes (Signed)
 RN reviewed discharge instructions with pt. Pt verbalized understanding and had no further questions. VSS upon discharge.  

## 2023-03-05 ENCOUNTER — Encounter: Payer: Self-pay | Admitting: Family Medicine

## 2023-03-05 ENCOUNTER — Other Ambulatory Visit: Payer: Self-pay | Admitting: Family Medicine

## 2023-03-05 MED ORDER — TAMSULOSIN HCL 0.4 MG PO CAPS: 0.4000 mg | ORAL_CAPSULE | Freq: Every day | ORAL | 1 refills | Status: DC

## 2023-03-05 NOTE — Progress Notes (Signed)
Prescription for Flomax sent.

## 2023-03-05 NOTE — Telephone Encounter (Signed)
Patient asking about kidney stones and sxs duration

## 2023-03-19 ENCOUNTER — Other Ambulatory Visit: Payer: Self-pay

## 2023-03-19 ENCOUNTER — Other Ambulatory Visit: Payer: Self-pay | Admitting: Family Medicine

## 2023-03-19 NOTE — Telephone Encounter (Signed)
Adipex 37.5 mg LOV: 01/30/23 Last Refill:we d/c this back in Dece 2023 it appears  Upcoming appt: no showed last apt and not another one made at this time

## 2023-03-22 ENCOUNTER — Other Ambulatory Visit: Payer: Self-pay

## 2023-03-23 ENCOUNTER — Encounter: Payer: Self-pay | Admitting: Family Medicine

## 2023-03-23 ENCOUNTER — Ambulatory Visit: Payer: Medicaid Other | Admitting: Family Medicine

## 2023-03-23 VITALS — BP 110/70 | HR 86 | Temp 98.5°F | Ht 63.0 in | Wt 141.0 lb

## 2023-03-23 DIAGNOSIS — F32A Depression, unspecified: Secondary | ICD-10-CM | POA: Diagnosis not present

## 2023-03-23 DIAGNOSIS — R638 Other symptoms and signs concerning food and fluid intake: Secondary | ICD-10-CM | POA: Diagnosis not present

## 2023-03-23 DIAGNOSIS — N200 Calculus of kidney: Secondary | ICD-10-CM

## 2023-03-23 DIAGNOSIS — F419 Anxiety disorder, unspecified: Secondary | ICD-10-CM

## 2023-03-23 MED ORDER — BUPROPION HCL ER (XL) 150 MG PO TB24: 150.0000 mg | ORAL_TABLET | Freq: Every day | ORAL | 3 refills | Status: DC

## 2023-03-23 NOTE — Assessment & Plan Note (Signed)
Pt has been quite successful recently w/ weight loss.  She is down 11 lbs since June.  Applauded her efforts at healthy diet and regular exercise.  She just recently restarted the Phentermine in hopes of maintaining her weight as she just started a new job.  Will continue to follow.

## 2023-03-23 NOTE — Patient Instructions (Addendum)
Schedule your complete physical after 12/15 INCREASE the Wellbutrin to 150mg  daily- new prescription sent Keep up the good work on healthy diet and regular exercise- you look great! Call with any questions or concerns Stay Safe!  Stay Healthy! Hang in there!

## 2023-03-23 NOTE — Assessment & Plan Note (Signed)
New.  Pt was seen in ER on 7/19.  Unfortunately they did not send her home w/ Flomax.  I prescribed this for her via MyChart and she reports she passed the stone.  Has not had any additional pain or bleeding.  Ok to stop Flomax.

## 2023-03-23 NOTE — Assessment & Plan Note (Signed)
Improved since starting Wellbutrin but still feels scattered and overwhelmed.  Will increase dose to 150mg  daily and monitor for improvement.  Pt expressed understanding and is in agreement w/ plan.

## 2023-03-23 NOTE — Progress Notes (Signed)
   Subjective:    Patient ID: Jennifer Summers, female    DOB: [birth year], 42 y.o.   MRN: [id number]  HPI Anxiety/Depression- started Wellbutrin 75mg  BID in June.  Pt feels that things are better, not sure if she wants to go up on it.  'i'm still kinda all over the place'.    Unable to lose weight- pt is down 11 lbs since June.  Just restarted Phentermine.  No palpitations, CP, SOB.  Kidney stone- pt was seen in ER on 7/19.  No longer having blood in urine.  Knows stone was passed b/c she felt immediate relief after 1 bloody void.  Wants to know if she can stop Flomax.   Review of Systems For ROS see HPI     Objective:   Physical Exam Vitals reviewed.  Constitutional:      General: She is not in acute distress.    Appearance: Normal appearance. She is not ill-appearing.  HENT:     Head: Normocephalic and atraumatic.  Cardiovascular:     Rate and Rhythm: Normal rate and regular rhythm.  Pulmonary:     Effort: Pulmonary effort is normal. No respiratory distress.  Skin:    General: Skin is warm and dry.  Neurological:     General: No focal deficit present.     Mental Status: She is alert and oriented to person, place, and time.  Psychiatric:        Mood and Affect: Mood normal.        Behavior: Behavior normal.        Thought Content: Thought content normal.           Assessment & Plan:

## 2023-03-28 ENCOUNTER — Other Ambulatory Visit: Payer: Self-pay

## 2023-03-28 ENCOUNTER — Other Ambulatory Visit (HOSPITAL_BASED_OUTPATIENT_CLINIC_OR_DEPARTMENT_OTHER): Payer: Self-pay

## 2023-03-28 ENCOUNTER — Other Ambulatory Visit (HOSPITAL_COMMUNITY): Payer: Self-pay

## 2023-05-21 ENCOUNTER — Other Ambulatory Visit: Payer: Self-pay | Admitting: Family Medicine

## 2023-06-07 ENCOUNTER — Ambulatory Visit: Payer: Medicaid Other | Admitting: Family Medicine

## 2023-06-07 VITALS — BP 102/70 | HR 90 | Temp 97.8°F | Ht 63.0 in | Wt 139.2 lb

## 2023-06-07 DIAGNOSIS — R490 Dysphonia: Secondary | ICD-10-CM

## 2023-06-07 MED ORDER — MELOXICAM 15 MG PO TABS: 15.0000 mg | ORAL_TABLET | Freq: Every day | ORAL | 0 refills | Status: DC

## 2023-06-07 NOTE — Patient Instructions (Signed)
Follow up as needed or as scheduled START the Meloxicam once daily- take w/ food AVOID other ibuprofen, motrin, aleve, etc Drink LOTS of fluids Hot tea w/ help soothe the vocal cords Vocal REST as much as possible Call with any questions or concerns Stay Safe!  Stay Healthy! Hang In There!!!

## 2023-06-07 NOTE — Progress Notes (Signed)
   Subjective:    Patient ID: Jennifer Summers, female    DOB: 05-07-1981, 42 y.o.   MRN: [id number]  HPI Hoarse- sxs started last week w/ sore throat.  Sore throat has improved but now has no voice.  Lost voice 2 days ago (this is day 3).  No fever.  No body aches.  No HA.  Denies sinus pain/pressure.  Minimal cough.   Review of Systems For ROS see HPI     Objective:   Physical Exam Vitals reviewed.  Constitutional:      General: She is not in acute distress.    Appearance: Normal appearance. She is not ill-appearing.  HENT:     Head: Normocephalic and atraumatic.     Right Ear: Tympanic membrane and ear canal normal.     Left Ear: Tympanic membrane and ear canal normal.     Nose: Nose normal. No congestion or rhinorrhea.     Comments: No TTP over frontal or maxillary sinuses    Mouth/Throat:     Mouth: Mucous membranes are moist.     Pharynx: Oropharynx is clear. No oropharyngeal exudate or posterior oropharyngeal erythema.  Cardiovascular:     Rate and Rhythm: Normal rate and regular rhythm.  Pulmonary:     Effort: Pulmonary effort is normal. No respiratory distress.     Breath sounds: Normal breath sounds. No wheezing.     Comments: No cough Musculoskeletal:     Cervical back: Normal range of motion.  Lymphadenopathy:     Cervical: No cervical adenopathy.  Skin:    General: Skin is warm and dry.  Neurological:     General: No focal deficit present.     Mental Status: She is alert and oriented to person, place, and time.  Psychiatric:        Mood and Affect: Mood normal.        Behavior: Behavior normal.        Thought Content: Thought content normal.           Assessment & Plan:   Hoarseness- new.  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  She is not interested in prednisone at this time, will start daily scheduled NSAID.  Reviewed supportive care and red flags that should prompt return.  Pt  expressed understanding and is in agreement w/ plan.

## 2023-07-30 ENCOUNTER — Encounter: Payer: Self-pay | Admitting: Family Medicine

## 2023-08-09 NOTE — Telephone Encounter (Signed)
error 

## 2023-10-13 ENCOUNTER — Other Ambulatory Visit: Payer: Self-pay | Admitting: Family Medicine

## 2023-10-15 ENCOUNTER — Other Ambulatory Visit: Payer: Self-pay

## 2023-10-15 DIAGNOSIS — R7989 Other specified abnormal findings of blood chemistry: Secondary | ICD-10-CM

## 2023-10-15 MED ORDER — PANTOPRAZOLE SODIUM 40 MG PO TBEC: 40.0000 mg | DELAYED_RELEASE_TABLET | Freq: Every day | ORAL | 1 refills | Status: DC

## 2023-10-15 NOTE — Telephone Encounter (Signed)
 Requested Prescriptions   Pending Prescriptions Disp Refills   pantoprazole (PROTONIX) 40 MG tablet 90 tablet 1    Sig: Take 1 tablet (40 mg total) by mouth daily.   phentermine (ADIPEX-P) 37.5 MG tablet 90 tablet 0     Date of patient request: 10/15/2023 Last office visit: 06/07/2023 Upcoming visit: Visit date not found Date of last refill: 03/19/2023 Last refill amount: 26   Looks like patient would have run out 3 months ago please advise

## 2023-10-15 NOTE — Telephone Encounter (Signed)
 Medication refill request: progesterone  Last AEX:  08/24/22  Next AEX: not scheduled  Last MMG (if hormonal medication request): none  Refill authorized: not sent to pharmacy for patient to schedule aex.

## 2023-10-16 NOTE — Telephone Encounter (Signed)
 Need AEX

## 2024-04-29 ENCOUNTER — Other Ambulatory Visit: Payer: Self-pay | Admitting: Family Medicine

## 2024-04-29 NOTE — Telephone Encounter (Signed)
 Called patient to make an appointment, no answer, VM full

## 2024-04-29 NOTE — Telephone Encounter (Signed)
 Pt need appt scheduled

## 2024-04-30 NOTE — Telephone Encounter (Signed)
 Called patient to make an appointment, no answer, VM full

## 2024-04-30 NOTE — Telephone Encounter (Signed)
 Unable to schedule pt.  Please advise on refill

## 2024-05-06 ENCOUNTER — Other Ambulatory Visit: Payer: Self-pay

## 2024-05-06 ENCOUNTER — Telehealth: Payer: Self-pay

## 2024-05-06 MED ORDER — PANTOPRAZOLE SODIUM 40 MG PO TBEC: 40.0000 mg | DELAYED_RELEASE_TABLET | Freq: Every day | ORAL | 0 refills | Status: DC

## 2024-05-06 NOTE — Telephone Encounter (Signed)
Refilled 90day

## 2024-05-06 NOTE — Telephone Encounter (Signed)
 Ok to provide #90 but pt needs to schedule CPE

## 2024-05-06 NOTE — Telephone Encounter (Signed)
 Request sent to Dr.Tabori

## 2024-05-12 ENCOUNTER — Encounter: Payer: Self-pay | Admitting: Family Medicine

## 2024-05-12 ENCOUNTER — Ambulatory Visit: Payer: Self-pay

## 2024-05-12 ENCOUNTER — Ambulatory Visit (INDEPENDENT_AMBULATORY_CARE_PROVIDER_SITE_OTHER): Admitting: Family Medicine

## 2024-05-12 VITALS — BP 102/76 | HR 85 | Temp 98.2°F | Resp 19 | Ht 63.0 in | Wt 128.0 lb

## 2024-05-12 DIAGNOSIS — F410 Panic disorder [episodic paroxysmal anxiety] without agoraphobia: Secondary | ICD-10-CM

## 2024-05-12 DIAGNOSIS — F32A Depression, unspecified: Secondary | ICD-10-CM

## 2024-05-12 DIAGNOSIS — K589 Irritable bowel syndrome without diarrhea: Secondary | ICD-10-CM | POA: Insufficient documentation

## 2024-05-12 DIAGNOSIS — R110 Nausea: Secondary | ICD-10-CM

## 2024-05-12 DIAGNOSIS — R002 Palpitations: Secondary | ICD-10-CM

## 2024-05-12 DIAGNOSIS — N926 Irregular menstruation, unspecified: Secondary | ICD-10-CM

## 2024-05-12 DIAGNOSIS — R5381 Other malaise: Secondary | ICD-10-CM | POA: Diagnosis not present

## 2024-05-12 DIAGNOSIS — F419 Anxiety disorder, unspecified: Secondary | ICD-10-CM

## 2024-05-12 DIAGNOSIS — G2581 Restless legs syndrome: Secondary | ICD-10-CM | POA: Insufficient documentation

## 2024-05-12 LAB — CBC
HCT: 42.4 % (ref 36.0–46.0)
Hemoglobin: 14 g/dL (ref 12.0–15.0)
MCHC: 33 g/dL (ref 30.0–36.0)
MCV: 87.5 fl (ref 78.0–100.0)
Platelets: 305 K/uL (ref 150.0–400.0)
RBC: 4.84 Mil/uL (ref 3.87–5.11)
RDW: 12.5 % (ref 11.5–15.5)
WBC: 6.1 K/uL (ref 4.0–10.5)

## 2024-05-12 LAB — COMPREHENSIVE METABOLIC PANEL WITH GFR
ALT: 23 U/L (ref 0–35)
AST: 21 U/L (ref 0–37)
Albumin: 4.7 g/dL (ref 3.5–5.2)
Alkaline Phosphatase: 62 U/L (ref 39–117)
BUN: 15 mg/dL (ref 6–23)
CO2: 32 meq/L (ref 19–32)
Calcium: 10 mg/dL (ref 8.4–10.5)
Chloride: 102 meq/L (ref 96–112)
Creatinine, Ser: 1.06 mg/dL (ref 0.40–1.20)
GFR: 64.67 mL/min (ref >60.00)
Glucose, Bld: 91 mg/dL (ref 70–99)
Potassium: 3.9 meq/L (ref 3.5–5.1)
Sodium: 140 meq/L (ref 135–145)
Total Bilirubin: 0.5 mg/dL (ref 0.2–1.2)
Total Protein: 7.7 g/dL (ref 6.0–8.3)

## 2024-05-12 LAB — POCT URINE PREGNANCY: Preg Test, Ur: NEGATIVE

## 2024-05-12 NOTE — Telephone Encounter (Signed)
 FYI Only or Action Required?: Action required by provider: request for appointment.  Patient was last seen in primary care on 06/07/2023 by Mahlon Comer BRAVO, MD.  Called Nurse Triage reporting Panic Attack.  Symptoms began today.  Interventions attempted: Nothing.  Symptoms are: stable. Had to leave work, felt shaky, nausea, SOB.  Triage Disposition: See Physician Within 24 Hours  Patient/caregiver understands and will follow disposition?:    Copied from CRM #[id number]. Topic: Clinical - Red Word Triage >> May 12, 2024  8:33 AM Timindy P wrote: Red Word that prompted transfer to Nurse Triage: Panic attack, worsening anxiety. Sent home from work. Reason for Disposition  Patient sounds very upset or troubled to the triager  Answer Assessment - Initial Assessment Questions 1. CONCERN: Did anything happen that prompted you to call today?      Panic attacks 2. ANXIETY SYMPTOMS: Can you describe how you (your loved one; patient) have been feeling? (e.g., tense, restless, panicky, anxious, keyed up, overwhelmed, sense of impending doom).      panic 3. ONSET: How long have you been feeling this way? (e.g., hours, days, weeks)     today 4. SEVERITY: How would you rate the level of anxiety? (e.g., 0 - 10; or mild, moderate, severe).     moderate 5. FUNCTIONAL IMPAIRMENT: How have these feelings affected your ability to do daily activities? Have you had more difficulty than usual doing your normal daily activities? (e.g., getting better, same, worse; self-care, school, work, interactions)     work 6. HISTORY: Have you felt this way before? Have you ever been diagnosed with an anxiety problem in the past? (e.g., generalized anxiety disorder, panic attacks, PTSD). If Yes, ask: How was this problem treated? (e.g., medicines, counseling, etc.)     yes 7. RISK OF HARM - SUICIDAL IDEATION: Do you ever have thoughts of hurting or killing yourself? If Yes, ask:  Do you have  these feelings now? Do you have a plan on how you would do this?     no 8. TREATMENT:  What has been done so far to treat this anxiety? (e.g., medicines, relaxation strategies). What has helped?     no 9. THERAPIST: Do you have a counselor or therapist? If Yes, ask: What is their name?     no 10. POTENTIAL TRIGGERS: Do you drink caffeinated beverages (e.g., coffee, colas, teas), and how much daily? Do you drink alcohol or use any drugs? Have you started any new medicines recently?       no 11. PATIENT SUPPORT: Who is with you now? Who do you live with? Do you have family or friends who you can talk to?        yes 12. OTHER SYMPTOMS: Do you have any other symptoms? (e.g., feeling depressed, trouble concentrating, trouble sleeping, trouble breathing, palpitations or fast heartbeat, chest pain, sweating, nausea, or diarrhea)       Nausea, shaky, SOB 13. PREGNANCY: Is there any chance you are pregnant? When was your last menstrual period?       NO  Protocols used: Anxiety and Panic Attack-A-AH

## 2024-05-12 NOTE — Patient Instructions (Signed)
 Thanks for coming in today.  I am sorry to hear about the event earlier today.  EKG looks okay at this time, unlikely cardiac issue.  I will check some blood work, electrolytes and let you know if there are any concerns.  Still could be panic attack, see information below.  I will defer medication changes to Dr. Mahlon, but would like you to follow-up with her in the next few weeks if possible.  If any recurrence of panic attack symptoms in the meantime, let us  know and we can look at changes sooner.  Try relax at home today, make sure to drink plenty of fluids, rest, and give me an update on your symptoms in the next few days.  If you are feeling better, can return to work tomorrow, otherwise we will have a note for you to be out for a few days.  Hang in there!  Return to the clinic or go to the nearest emergency room if any of your symptoms worsen or new symptoms occur.  Panic Attack A panic attack is a sudden episode of severe anxiety, fear, or discomfort that causes physical and emotional symptoms. A panic attack may be in response to something frightening, or it may occur for no known reason. Symptoms of a panic attack can be similar to symptoms of a heart attack or stroke. It is important to see your health care provider when you have a panic attack so that these conditions can be ruled out. What are the causes? A panic attack may be caused by: An extreme, life-threatening situation, such as a war or natural disaster. An anxiety disorder, such as post-traumatic stress disorder. Depression. Panic disorder. Certain medical conditions, including heart problems, neurological conditions, and infections. Other causes may include: Certain over-the-counter and prescription medicines. Supplements that increase anxiety. Illegal drugs that increase heart rate and blood pressure, such as methamphetamine. What increases the risk? You are more likely to develop this condition if: You have another mental  health condition. You use alcohol, illegal drugs, or other substances. You are under extreme stress. A life event is causing increased feelings of anxiety and depression. What are the signs or symptoms? A panic attack starts suddenly, usually lasts 5-10 minutes, and occurs with one or more of the following: A pounding heart, or a feeling that your heart is beating irregularly or faster than normal (palpitations). Sweating, trembling, or shaking. Shortness of breath, feeling smothered, or feeling choked. Chest pain or discomfort. Nausea or a strange feeling in your stomach. Dizziness, feeling light-headed, or feeling like you might faint. Other symptoms may include: Chills or hot flashes. Numbness or tingling in your lips, hands, or feet. Feeling confused, or feeling that you are not yourself. Fear of losing control or of being emotionally unstable, or fear of dying. How is this diagnosed? A panic attack is diagnosed with an assessment by your health care provider. During the assessment, your health care provider will ask questions about: Your history of anxiety, depression, and panic attacks. Your medical history. Whether you drink alcohol, use drugs, take supplements, or take medicines. Be honest about your substance use. Your health care provider may also: Order blood tests or other kinds of tests to rule out serious medical conditions. Refer you to a mental health professional for further evaluation. How is this treated? A panic attack is a symptom of another condition. Treatment depends on the cause of the panic attack. If the cause is a medical problem, your health care provider will  treat that problem or refer you to a specialist. If the cause is emotional, you may be given anti-anxiety medicines or referred to a counselor. Anti-anxiety medicines may reduce how often attacks happen, reduce how severe the attacks are, and lower anxiety. If the cause is a medicine, your health care  provider may tell you to stop the medicine, change your dose, or take a different medicine. If the cause is an illegal drug, treatment may involve letting the drug wear off and taking medicine to help the drug leave your body or to stop its effects. Attacks caused by heavy drug use may continue even if you stop using the drug. Most panic attacks go away with treatment of the underlying problem. If you have panic attacks often, you may have a condition called panic disorder. Follow these instructions at home: Alcohol use Do not drink alcohol if: Your health care provider tells you not to drink. You are pregnant, may be pregnant, or are planning to become pregnant. If you drink alcohol: Limit how much you have to: 0-1 drink a day for women. 0-2 drinks a day for men. Know how much alcohol is in your drink. In the [place] one drink equals one 12 oz bottle of beer (355 mL), one 5 oz glass of wine (148 mL), or one 1 oz glass of hard liquor (44 mL). General instructions Take over-the-counter and prescription medicines only as told by your health care provider. If you feel anxious, limit your caffeine intake. Take good care of your physical and mental health by: Eating a balanced diet that includes plenty of fresh fruits and vegetables, whole grains, lean meats, and low-fat dairy. Getting plenty of rest. Try to get 7-8 hours of uninterrupted sleep each night. Exercising regularly. Try to get 30 minutes of physical activity at least 5 days a week. Do not use any products that contain nicotine or tobacco. These products include cigarettes, chewing tobacco, and vaping devices, such as e-cigarettes. If you need help quitting, ask your health care provider. Keep all follow-up visits. This is important. Panic attacks may have underlying physical or emotional problems that take time to accurately diagnose. Where to find more information [facility]):  [employer] [facility]): http://www.maynard.net/ Contact a health care provider if: Your symptoms do not improve, or they get worse. You are not able to take your medicine as prescribed because of side effects. Get help right away if: You have thoughts about hurting yourself or others. Get help right away if you feel like you may hurt yourself or others, or have thoughts about taking your own life. Go to your nearest emergency room or: Call 911. Call the National Suicide Prevention Lifeline at 907-111-4173 or 988. This is open 24 hours a day. Text the Crisis Text Line at 740-501-1129. Summary A panic attack is a sudden episode of severe anxiety, fear, or discomfort that causes physical and emotional symptoms. Always see a health care provider to have the reasons for the panic attack correctly diagnosed. If your panic attack was caused by a physical problem, follow your health care provider's suggestions for medicine, referral to a specialist, and lifestyle changes. If your panic attack was caused by an emotional problem, follow through with counseling from a qualified mental health specialist. If you feel like you may hurt yourself or others, call 911 and get help right away. This information is not intended to replace advice given to you by your health  care provider. Make sure you discuss any questions you have with your health care provider. Document Revised: 03/10/2021 Document Reviewed: 03/10/2021 [employer] Patient Education  2024 [employer].

## 2024-05-12 NOTE — Progress Notes (Signed)
 Subjective:  Patient ID: Jennifer Summers, female    DOB: Aug 12, 1981  Age: 43 y.o. MRN: [id number]  CC:  Chief Complaint  Patient presents with   Panic Attack    Last panic attack was today. Lasted about 30-40 minutes. Nausea. Heart racing and shaky. Panic attack came out of the blue while she was at work. Unknown what prompted it. Crying.     HPI Jennifer Summers presents for   Acute visit for above.  Primary care provider is Dr. Mahlon.  Last visit with Dr. Mahlon in October 2024 for acute issue.  Anxiety depression discussed in August 2024.  Physical recommended in 4 months.  Anxiety/depression/panic attack As discussed with PCP in August 2024.  She had been started on Wellbutrin  75 mg twice daily few months earlier with some improvement but still with some feelings of being scattered and overwhelmed.  Wellbutrin  was changed to 150mg  XL, twice daily at that time.  Presents today after a panic attack that lasted approximately 30 to 40 minutes, unknown cause.  She felt fine when she woke up. Was at work - talking to a patient. Converstaion was going ok, no specific stressors recently. She was at work when this occurred.  Felt nausea, shaky, heart was racing, felt short of breath. Sore in abdominal area. Sat down to catch breath, improved after rest - 30 mins. Hands tingling during event, no perioral sx's.  No vomiting, but still feels nausea, out of it. No current palpitations. Job is stressful, but nothing new. Has been trying to cut back on caffeine. Preworkout powders - decreasing dose, no abrupt stops.  Symptoms feel like the day after migraine, but no recent HA.  Has had anxiety/panic attacks in past, not recent and not to this extent as she was shortness of breath with current  No new meds recently.  Unknown LMP - some spotting 2 months ago. Irregular - off for a year, then back for 2months.   Tx: zofran  - once today.       05/12/2024   10:25 AM 06/07/2023   10:24 AM 03/23/2023     7:40 AM 01/15/2023    9:44 AM  GAD 7 : Generalized Anxiety Score  Nervous, Anxious, on Edge 3 3 3 3   Control/stop worrying 3 3 3 3   Worry too much - different things 2 3 3 3   Trouble relaxing 3 2 3 3   Restless 2 3 2 2   Easily annoyed or irritable 1 1 1 3   Afraid - awful might happen 1 0 0 1  Total GAD 7 Score 15 15 15 18   Anxiety Difficulty  Not difficult at all Not difficult at all Very difficult       05/12/2024   10:26 AM 06/07/2023   10:24 AM 03/23/2023    7:39 AM 01/15/2023    9:43 AM 10/31/2022   10:15 AM  Depression screen PHQ 2/9  Decreased Interest 0 0 0 2 0  Down, Depressed, Hopeless 0 0 0 1 0  PHQ - 2 Score 0 0 0 3 0  Altered sleeping 0 3 3 3  0  Tired, decreased energy 3 3 3 3  0  Change in appetite 3 0 1 1 0  Feeling bad or failure about yourself  0 0 0 3 0  Trouble concentrating 3 0 3 3 0  Moving slowly or fidgety/restless 3 0 3 3 0  Suicidal thoughts 0 0 0 0 0  PHQ-9 Score 12 6 13 19  0  Difficult  doing work/chores Extremely dIfficult Somewhat difficult Not difficult at all Very difficult Not difficult at all     History Patient Active Problem List   Diagnosis Date Noted   Irritable bowel syndrome 05/12/2024   Restless legs syndrome 05/12/2024   Kidney stone 03/23/2023   Physical exam 07/28/2022   Unable to lose weight 07/24/2022   COVID-19 virus infection 10/03/2020   Bilateral lower extremity edema 10/03/2020   Hypokalemia 10/03/2020   H. pylori infection 12/25/2019   Abnormal TSH 12/25/2019   Vitamin D  deficiency    Anxiety and depression 08/28/2019   Chronic GERD 08/25/2019   Iron deficiency anemia 06/27/2016   Protein S deficiency 11/01/2005   Past Medical History:  Diagnosis Date   Acid reflux    Anxiety    Blood transfusion without reported diagnosis 2007   Cervical disc disorder 09/14/2017   Very minimal disc space narrowing C4-5 and C5-6 by x-ray.   Depression 2019   IBS (irritable bowel syndrome)    Immunity status testing 04/16/2013    Tested positive for immunity measles/mumps/rubella and varicella.   Iron deficiency anemia    Patient reports having to have iron/blood infusions in the past.   Kidney stone    Migraines    Pancreatitis    Placenta previa    Prediabetes 11/08/2018   Protein S deficiency    Right upper quadrant abdominal pain 04/17/2017   Vitamin D  deficiency    Past Surgical History:  Procedure Laterality Date   CESAREAN SECTION     x 4.  2007, 2008, 2009, 2010.  2007 was emergent C-section.   CESAREAN SECTION N/A 09/28/2020   Procedure: REPEAT CESAREAN SECTION EDC: 10-13-20 ALLERGIES: CODEINE;  Surgeon: Curlene Agent, MD;  Location: [facility] LD ORS;  Service: Obstetrics;  Laterality: N/A;   HERNIA REPAIR  2010   TONSILLECTOMY  2000   TUBAL LIGATION  2010, tubal repair 2020   Tubal sterilization reversal  01/2019   Allergies  Allergen Reactions   Codeine Other (See Comments)    Unknown childhood reaction.   Prior to Admission medications   Medication Sig Start Date End Date Taking? Authorizing Provider  buPROPion  (WELLBUTRIN  XL) 150 MG 24 hr tablet TAKE 1 TABLET(150 MG) BY MOUTH DAILY 04/30/24  Yes Tabori, Katherine E, MD  meloxicam  (MOBIC ) 15 MG tablet Take 1 tablet (15 mg total) by mouth daily. Patient not taking: Reported on 05/12/2024 06/07/23   Mahlon Comer BRAVO, MD  naproxen  (NAPROSYN ) 375 MG tablet Take 1 tablet (375 mg total) by mouth 2 (two) times daily. Patient not taking: Reported on 05/12/2024 03/03/23   Trine Raynell Moder, MD  pantoprazole  (PROTONIX ) 40 MG tablet Take 1 tablet (40 mg total) by mouth daily. Patient not taking: Reported on 05/12/2024 05/06/24   Tabori, Katherine E, MD  phentermine  (ADIPEX-P ) 37.5 MG tablet TAKE 1 TABLET(37.5 MG) BY MOUTH DAILY BEFORE BREAKFAST Patient not taking: Reported on 05/12/2024 10/15/23   Tabori, Katherine E, MD  progesterone  (PROMETRIUM ) 100 MG capsule Take one capsule po on days 1-12 of cycle. Patient not taking: Reported on 05/12/2024 08/25/22    Chrzanowski, Jami B, NP  propranolol  (INDERAL ) 10 MG tablet TAKE 1 TABLET (10 MG TOTAL) BY MOUTH 3 (THREE) TIMES DAILY AS NEEDED. Patient not taking: Reported on 05/12/2024 11/07/21 01/15/23  Mahlon Comer BRAVO, MD   Social History   Socioeconomic History   Marital status: Married    Spouse name: Dorn   Number of children: 5   Years of education: Not  on file   Highest education level: Associate degree: occupational, Scientist, product/process development, or vocational program  Occupational History   Not on file  Tobacco Use   Smoking status: Never   Smokeless tobacco: Never  Vaping Use   Vaping status: Never Used  Substance and Sexual Activity   Alcohol use: No   Drug use: No   Sexual activity: Yes    Partners: Male  Other Topics Concern   Not on file  Social History Narrative   Marital status/children/pets: Married.  5 children.   Education/employment: Some college.  Employed as an Geophysical data processor:       Social Drivers of Corporate investment banker Strain: Low Risk  (06/07/2023)   Overall Financial Resource Strain (CARDIA)    Difficulty of Paying Living Expenses: Not very hard  Food Insecurity: No Food Insecurity (06/07/2023)   Hunger Vital Sign    Worried About Running Out of Food in the Last Year: Never true    Ran Out of Food in the Last Year: Never true  Transportation Needs: No Transportation Needs (06/07/2023)   PRAPARE - Administrator, Civil Service (Medical): No    Lack of Transportation (Non-Medical): No  Physical Activity: Sufficiently Active (06/07/2023)   Exercise Vital Sign    Days of Exercise per Week: 7 days    Minutes of Exercise per Session: 30 min  Stress: Stress Concern Present (06/07/2023)   [employer] of Occupational Health - Occupational Stress Questionnaire    Feeling of Stress : Very much  Social Connections: Moderately Integrated (06/07/2023)   Social Connection and Isolation Panel    Frequency of Communication with Friends and Family: Three  times a week    Frequency of Social Gatherings with Friends and Family: Patient declined    Attends Religious Services: 1 to 4 times per year    Active Member of [employer] or Organizations: No    Attends Engineer, structural: Not on file    Marital Status: Married  Intimate Partner Violence: Unknown (11/30/2021)   Received from [facility]   HITS    Physically Hurt: Not on file    Insult or Talk Down To: Not on file    Threaten Physical Harm: Not on file    Scream or Curse: Not on file    Review of Systems Per HPI.   Objective:   Vitals:   05/12/24 1022  BP: 102/76  Pulse: 85  Resp: 19  Temp: 98.2 F (36.8 C)  TempSrc: Temporal  SpO2: 99%  Weight: 128 lb (58.1 kg)  Height: 5' 3 (1.6 m)     Physical Exam Vitals reviewed.  Constitutional:      General: She is not in acute distress.    Appearance: Normal appearance. She is well-developed. She is not ill-appearing, toxic-appearing or diaphoretic.  HENT:     Head: Normocephalic and atraumatic.  Eyes:     Conjunctiva/sclera: Conjunctivae normal.     Pupils: Pupils are equal, round, and reactive to light.  Neck:     Vascular: No carotid bruit.  Cardiovascular:     Rate and Rhythm: Normal rate and regular rhythm.     Heart sounds: Normal heart sounds. No murmur heard. Pulmonary:     Effort: Pulmonary effort is normal. No respiratory distress.     Breath sounds: Normal breath sounds. No wheezing or rales.  Abdominal:     General: There is no distension.     Palpations: Abdomen is soft. There  is no pulsatile mass.     Tenderness: There is no abdominal tenderness. There is no guarding.  Musculoskeletal:     Right lower leg: No edema.     Left lower leg: No edema.  Skin:    General: Skin is warm and dry.  Neurological:     General: No focal deficit present.     Mental Status: She is alert and oriented to person, place, and time.     Cranial Nerves: No cranial nerve deficit or facial asymmetry.     Sensory:  No sensory deficit.     Motor: No weakness, tremor or pronator drift.     Coordination: Coordination normal. Heel to Shin Test normal.  Psychiatric:        Mood and Affect: Mood normal.        Behavior: Behavior normal.    EKG, sinus rhythm with rate 73, PR 174, QTc 427.  No apparent acute ST or T wave changes.  No ectopy.Compared to 10/20/2020, sinus bradycardia at that time.  No apparent significant changes.  Results for orders placed or performed in visit on 05/12/24  POCT urine pregnancy   Collection Time: 05/12/24 11:25 AM  Result Value Ref Range   Preg Test, Ur Negative Negative     Assessment & Plan:  Keali Mccraw is a 43 y.o. female . Anxiety and depression  Panic attack  Nausea - Plan: POCT urine pregnancy  Malaise  Palpitations - Plan: CBC, TSH, Comprehensive metabolic panel with GFR, EKG 12-Lead  Abnormal menses - Plan: POCT urine pregnancy  Episode of palpitations, nausea, malaise as above.  Dysesthesias of hands, temporary dyspnea.  Symptoms improved with rest, reassuring, nonfocal exam at this time.  EKG was reassuring at this time.  Will hold on outpatient cardiac monitoring for now unless symptoms recur.  With associated paresthesias certainly could have been a panic attack.  Denies any specific triggers or anxiety flares recently.  GAD-7 and PHQ noted as above, may have some room for improvement but will defer to follow-up with her PCP.  RTC precautions given if any recurrence of symptoms.  Rest, fluids today, return to work tomorrow if symptoms have resolved, or 1 additional day off if needed.   No orders of the defined types were placed in this encounter.  Patient Instructions  Thanks for coming in today.  I am sorry to hear about the event earlier today.  EKG looks okay at this time, unlikely cardiac issue.  I will check some blood work, electrolytes and let you know if there are any concerns.  Still could be panic attack, see information below.  I will  defer medication changes to Dr. Mahlon, but would like you to follow-up with her in the next few weeks if possible.  If any recurrence of panic attack symptoms in the meantime, let us  know and we can look at changes sooner.  Try relax at home today, make sure to drink plenty of fluids, rest, and give me an update on your symptoms in the next few days.  If you are feeling better, can return to work tomorrow, otherwise we will have a note for you to be out for a few days.  Hang in there!  Return to the clinic or go to the nearest emergency room if any of your symptoms worsen or new symptoms occur.  Panic Attack A panic attack is a sudden episode of severe anxiety, fear, or discomfort that causes physical and emotional symptoms. A panic attack  may be in response to something frightening, or it may occur for no known reason. Symptoms of a panic attack can be similar to symptoms of a heart attack or stroke. It is important to see your health care provider when you have a panic attack so that these conditions can be ruled out. What are the causes? A panic attack may be caused by: An extreme, life-threatening situation, such as a war or natural disaster. An anxiety disorder, such as post-traumatic stress disorder. Depression. Panic disorder. Certain medical conditions, including heart problems, neurological conditions, and infections. Other causes may include: Certain over-the-counter and prescription medicines. Supplements that increase anxiety. Illegal drugs that increase heart rate and blood pressure, such as methamphetamine. What increases the risk? You are more likely to develop this condition if: You have another mental health condition. You use alcohol, illegal drugs, or other substances. You are under extreme stress. A life event is causing increased feelings of anxiety and depression. What are the signs or symptoms? A panic attack starts suddenly, usually lasts 5-10 minutes, and occurs  with one or more of the following: A pounding heart, or a feeling that your heart is beating irregularly or faster than normal (palpitations). Sweating, trembling, or shaking. Shortness of breath, feeling smothered, or feeling choked. Chest pain or discomfort. Nausea or a strange feeling in your stomach. Dizziness, feeling light-headed, or feeling like you might faint. Other symptoms may include: Chills or hot flashes. Numbness or tingling in your lips, hands, or feet. Feeling confused, or feeling that you are not yourself. Fear of losing control or of being emotionally unstable, or fear of dying. How is this diagnosed? A panic attack is diagnosed with an assessment by your health care provider. During the assessment, your health care provider will ask questions about: Your history of anxiety, depression, and panic attacks. Your medical history. Whether you drink alcohol, use drugs, take supplements, or take medicines. Be honest about your substance use. Your health care provider may also: Order blood tests or other kinds of tests to rule out serious medical conditions. Refer you to a mental health professional for further evaluation. How is this treated? A panic attack is a symptom of another condition. Treatment depends on the cause of the panic attack. If the cause is a medical problem, your health care provider will treat that problem or refer you to a specialist. If the cause is emotional, you may be given anti-anxiety medicines or referred to a counselor. Anti-anxiety medicines may reduce how often attacks happen, reduce how severe the attacks are, and lower anxiety. If the cause is a medicine, your health care provider may tell you to stop the medicine, change your dose, or take a different medicine. If the cause is an illegal drug, treatment may involve letting the drug wear off and taking medicine to help the drug leave your body or to stop its effects. Attacks caused by heavy drug  use may continue even if you stop using the drug. Most panic attacks go away with treatment of the underlying problem. If you have panic attacks often, you may have a condition called panic disorder. Follow these instructions at home: Alcohol use Do not drink alcohol if: Your health care provider tells you not to drink. You are pregnant, may be pregnant, or are planning to become pregnant. If you drink alcohol: Limit how much you have to: 0-1 drink a day for women. 0-2 drinks a day for men. Know how much alcohol is in your drink.  In the [place] one drink equals one 12 oz bottle of beer (355 mL), one 5 oz glass of wine (148 mL), or one 1 oz glass of hard liquor (44 mL). General instructions Take over-the-counter and prescription medicines only as told by your health care provider. If you feel anxious, limit your caffeine intake. Take good care of your physical and mental health by: Eating a balanced diet that includes plenty of fresh fruits and vegetables, whole grains, lean meats, and low-fat dairy. Getting plenty of rest. Try to get 7-8 hours of uninterrupted sleep each night. Exercising regularly. Try to get 30 minutes of physical activity at least 5 days a week. Do not use any products that contain nicotine or tobacco. These products include cigarettes, chewing tobacco, and vaping devices, such as e-cigarettes. If you need help quitting, ask your health care provider. Keep all follow-up visits. This is important. Panic attacks may have underlying physical or emotional problems that take time to accurately diagnose. Where to find more information Substance Abuse and Mental Health Services Administration [facility]): [employer] of Mental Health [facility]): http://www.maynard.net/ Contact a health care provider if: Your symptoms do not improve, or they get worse. You are not able to take your medicine as prescribed because of side effects. Get help right away if: You have thoughts  about hurting yourself or others. Get help right away if you feel like you may hurt yourself or others, or have thoughts about taking your own life. Go to your nearest emergency room or: Call 911. Call the National Suicide Prevention Lifeline at 361-488-0494 or 988. This is open 24 hours a day. Text the Crisis Text Line at (618) 346-5286. Summary A panic attack is a sudden episode of severe anxiety, fear, or discomfort that causes physical and emotional symptoms. Always see a health care provider to have the reasons for the panic attack correctly diagnosed. If your panic attack was caused by a physical problem, follow your health care provider's suggestions for medicine, referral to a specialist, and lifestyle changes. If your panic attack was caused by an emotional problem, follow through with counseling from a qualified mental health specialist. If you feel like you may hurt yourself or others, call 911 and get help right away. This information is not intended to replace advice given to you by your health care provider. Make sure you discuss any questions you have with your health care provider. Document Revised: 03/10/2021 Document Reviewed: 03/10/2021 Elsevier Patient Education  2024 Elsevier Inc.     Signed,   Reyes Pines, MD [facility], [facility] 05/12/24 11:42 AM

## 2024-05-13 ENCOUNTER — Ambulatory Visit: Payer: Self-pay | Admitting: Family Medicine

## 2024-05-13 LAB — TSH: TSH: 1.36 u[IU]/mL (ref 0.35–5.50)

## 2024-05-26 ENCOUNTER — Encounter: Payer: Self-pay | Admitting: Family Medicine

## 2024-05-26 ENCOUNTER — Ambulatory Visit: Admitting: Family Medicine

## 2024-05-26 VITALS — BP 110/68 | HR 103 | Temp 98.2°F | Ht 63.0 in | Wt 130.0 lb

## 2024-05-26 DIAGNOSIS — F32A Depression, unspecified: Secondary | ICD-10-CM | POA: Diagnosis not present

## 2024-05-26 DIAGNOSIS — Z1231 Encounter for screening mammogram for malignant neoplasm of breast: Secondary | ICD-10-CM

## 2024-05-26 DIAGNOSIS — F419 Anxiety disorder, unspecified: Secondary | ICD-10-CM | POA: Diagnosis not present

## 2024-05-26 DIAGNOSIS — Z124 Encounter for screening for malignant neoplasm of cervix: Secondary | ICD-10-CM | POA: Diagnosis not present

## 2024-05-26 MED ORDER — FLUOXETINE HCL 20 MG PO CAPS: 20.0000 mg | ORAL_CAPSULE | Freq: Every day | ORAL | 3 refills | Status: DC

## 2024-05-26 MED ORDER — ALPRAZOLAM 0.5 MG PO TABS: 0.5000 mg | ORAL_TABLET | Freq: Two times a day (BID) | ORAL | 1 refills | Status: AC | PRN

## 2024-05-26 NOTE — Progress Notes (Signed)
   Subjective:    Patient ID: Jennifer Summers, female    DOB: 1981-05-20, 43 y.o.   MRN: [id number]  HPI Anxiety- pt saw Dr Levora on 9/29 for possible panic attack.  Currently on Wellbutrin  150mg  daily.  Pt reports feeling more 'nervous' than usual after having that episode at work.  She worries that this may happen again.  She reports feeling fine prior to the episode but then started to feel 'off'- then developed nausea, sweating, shaking, SOB.  States she had eaten that morning.  Currently on Wellbutrin  XL 150mg  daily. Doesn't feel Wellbutrin  is controlling her current anxiety and would like to add or switch to something else.    Review of Systems For ROS see HPI     Objective:   Physical Exam Vitals reviewed.  Constitutional:      General: She is not in acute distress.    Appearance: Normal appearance. She is not ill-appearing.  HENT:     Head: Normocephalic and atraumatic.  Eyes:     Extraocular Movements: Extraocular movements intact.     Conjunctiva/sclera: Conjunctivae normal.  Cardiovascular:     Rate and Rhythm: Normal rate and regular rhythm.  Pulmonary:     Effort: Pulmonary effort is normal. No respiratory distress.  Skin:    General: Skin is warm and dry.  Neurological:     General: No focal deficit present.     Mental Status: She is alert and oriented to person, place, and time.  Psychiatric:        Mood and Affect: Mood normal.        Behavior: Behavior normal.        Thought Content: Thought content normal.           Assessment & Plan:

## 2024-05-26 NOTE — Assessment & Plan Note (Signed)
 Deteriorated.  Pt doesn't feel Wellbutrin  is controlling her anxiety at this point.  She is not sure if she is on the 75mg  or the 150mg  dose so she will let me know via MyChart and we can determine how best to transition off this.  In the meantime, will start Fluoxetine  20mg  daily and also add Alprazolam prn for panicked moments.  Will follow closely and adjust meds prn.  Pt expressed understanding and is in agreement w/ plan.

## 2024-05-26 NOTE — Patient Instructions (Signed)
 Follow up in 1 month to recheck mood START the Fluoxetine  once daily USE the Alprazolam as needed for panicked moments Let me know what dose of Wellbutrin  you're taking so we can best get you off it Call with any questions or concerns Hang in there!!!

## 2024-05-28 ENCOUNTER — Telehealth: Payer: Self-pay

## 2024-05-28 NOTE — Telephone Encounter (Signed)
 This is a duplicate message  Message was sent to Dr. Mahlon about wellbutrin  150mg .

## 2024-05-28 NOTE — Telephone Encounter (Signed)
 Copied from CRM #[id number]. Topic: Clinical - Medication Question >> May 28, 2024  3:14 PM Jennifer Summers wrote: Reason for CRM: Patient said her provider told her to call and adviser on what strength of buPROPion  (WELLBUTRIN  XL) 150 MG 24 hr tablet [[id number]] she was on and she has not taken it in two days because she reached out on MyChart regarding how to wean off the medication.

## 2024-05-29 NOTE — Telephone Encounter (Addendum)
 Patient states she has stopped the bupropion  on Monday 05/26/2024 and has started the Prozac  on that Monday 10/13 as well. Patient is questioning if this was the correct way to stop and start the medications as directed?

## 2024-06-02 MED ORDER — BUPROPION HCL 75 MG PO TABS: 75.0000 mg | ORAL_TABLET | Freq: Two times a day (BID) | ORAL | 3 refills | Status: DC

## 2024-06-02 NOTE — Addendum Note (Signed)
 Addended by: Carla Whilden E on: 06/02/2024 09:11 AM   Modules accepted: Orders

## 2024-06-26 ENCOUNTER — Other Ambulatory Visit: Payer: Self-pay | Admitting: Medical Genetics

## 2024-07-03 ENCOUNTER — Encounter: Payer: Self-pay | Admitting: Family Medicine

## 2024-07-03 ENCOUNTER — Ambulatory Visit: Admitting: Family Medicine

## 2024-07-03 VITALS — BP 100/82 | HR 78 | Temp 98.0°F | Ht 63.0 in | Wt 131.5 lb

## 2024-07-03 DIAGNOSIS — F419 Anxiety disorder, unspecified: Secondary | ICD-10-CM

## 2024-07-03 DIAGNOSIS — F32A Depression, unspecified: Secondary | ICD-10-CM

## 2024-07-03 MED ORDER — FLUOXETINE HCL 40 MG PO CAPS: 40.0000 mg | ORAL_CAPSULE | Freq: Every day | ORAL | 3 refills | Status: AC

## 2024-07-03 NOTE — Patient Instructions (Signed)
 Follow up in 6 weeks to recheck mood INCREASE Fluoxetine  to 40mg  daily- 2 of what you have at home and 1 of the new prescription Call with any questions or concerns Stay Safe!  Stay Healthy! Happy Holidays!!!

## 2024-07-03 NOTE — Progress Notes (Signed)
   Subjective:    Patient ID: Jennifer Summers, female    DOB: 1980-10-22, 43 y.o.   MRN: [id number]  HPI Anxiety/Depression- ongoing issue. At last visit, started Fluoxetine  20mg  daily.  Pt reports 'definitely better than last time'.  Denies side effects.  Taking Wellbutrin  every other day as she tapers meds.     Review of Systems For ROS see HPI     Objective:   Physical Exam Vitals reviewed.  Constitutional:      General: She is not in acute distress.    Appearance: Normal appearance. She is not ill-appearing.  HENT:     Head: Normocephalic and atraumatic.  Skin:    General: Skin is warm and dry.  Neurological:     General: No focal deficit present.     Mental Status: She is alert and oriented to person, place, and time.  Psychiatric:        Mood and Affect: Mood normal.        Behavior: Behavior normal.        Thought Content: Thought content normal.           Assessment & Plan:

## 2024-07-06 NOTE — Assessment & Plan Note (Signed)
 Improved w/ Fluoxetine .  Will increase to 40mg  daily for even better control.  Can stop Wellbutrin  entirely.  Pt expressed understanding and is in agreement w/ plan.

## 2024-08-06 ENCOUNTER — Other Ambulatory Visit: Payer: Self-pay

## 2024-08-21 ENCOUNTER — Ambulatory Visit: Admitting: Family Medicine

## 2024-09-15 ENCOUNTER — Encounter: Payer: Self-pay | Admitting: Family Medicine

## 2024-09-15 ENCOUNTER — Other Ambulatory Visit: Payer: Self-pay | Admitting: Family Medicine

## 2024-09-15 ENCOUNTER — Other Ambulatory Visit: Payer: Self-pay | Admitting: Medical Genetics

## 2024-09-15 DIAGNOSIS — Z006 Encounter for examination for normal comparison and control in clinical research program: Secondary | ICD-10-CM

## 2024-09-17 ENCOUNTER — Ambulatory Visit: Admitting: Family Medicine

## 2024-09-17 ENCOUNTER — Encounter: Payer: Self-pay | Admitting: Family Medicine

## 2024-09-17 VITALS — BP 104/74 | HR 73 | Temp 97.8°F | Ht 63.0 in | Wt 147.6 lb

## 2024-09-17 DIAGNOSIS — R635 Abnormal weight gain: Secondary | ICD-10-CM

## 2024-09-17 DIAGNOSIS — N926 Irregular menstruation, unspecified: Secondary | ICD-10-CM

## 2024-09-17 NOTE — Progress Notes (Unsigned)
" ° °  Subjective:    Patient ID: Jennifer Summers, female    DOB: October 14, 1980, 44 y.o.   MRN: [id number]  HPI Overweight- pt has gained 16 lbs since 11/20.  Denies changes to eating habits.  Clothes do not fit like they used to.  Continues to exercise regularly- mostly for stress relief.  Is starting to have mild hot flashes.  Feels that things are going well emotionally.  Periods are irregular.  Will feel pregnant- excessive fatigue, breast soreness, nausea, GERD- take a test, it will be + but then sxs will pass and repeat testing will subsequently be negative.   Review of Systems For ROS see HPI     Objective:   Physical Exam        Assessment & Plan:    "

## 2024-09-17 NOTE — Patient Instructions (Signed)
 Follow up as needed or as scheduled We'll notify you of your lab results and determine the next steps We'll call you to schedule your GYN appt for the fluctuating HCG levels Keep up the good work on healthy diet and regular exercise- you've got this! Call with any questions or concerns Hang in there!

## 2024-09-18 ENCOUNTER — Ambulatory Visit: Payer: Self-pay | Admitting: Family Medicine

## 2024-09-18 LAB — FSH/LH
FSH: 100.8 m[IU]/mL
LH: 50.8 m[IU]/mL

## 2024-09-18 LAB — CBC WITH DIFFERENTIAL/PLATELET
Basophils Absolute: 0 10*3/uL (ref 0.0–0.1)
Basophils Relative: 0.9 % (ref 0.0–3.0)
Eosinophils Absolute: 0 10*3/uL (ref 0.0–0.7)
Eosinophils Relative: 0.8 % (ref 0.0–5.0)
HCT: 41.6 % (ref 36.0–46.0)
Hemoglobin: 13.7 g/dL (ref 12.0–15.0)
Lymphocytes Relative: 35.4 % (ref 12.0–46.0)
Lymphs Abs: 1.8 10*3/uL (ref 0.7–4.0)
MCHC: 33 g/dL (ref 30.0–36.0)
MCV: 88.3 fl (ref 78.0–100.0)
Monocytes Absolute: 0.3 10*3/uL (ref 0.1–1.0)
Monocytes Relative: 6.5 % (ref 3.0–12.0)
Neutro Abs: 2.9 10*3/uL (ref 1.4–7.7)
Neutrophils Relative %: 56.4 % (ref 43.0–77.0)
Platelets: 321 10*3/uL (ref 150.0–400.0)
RBC: 4.71 Mil/uL (ref 3.87–5.11)
RDW: 12.6 % (ref 11.5–15.5)
WBC: 5.1 10*3/uL (ref 4.0–10.5)

## 2024-09-18 LAB — HEMOGLOBIN A1C: Hgb A1c MFr Bld: 5.8 % (ref 4.6–6.5)

## 2024-09-18 LAB — HEPATIC FUNCTION PANEL
ALT: 23 U/L (ref 3–35)
AST: 25 U/L (ref 5–37)
Albumin: 4.7 g/dL (ref 3.5–5.2)
Alkaline Phosphatase: 85 U/L (ref 39–117)
Bilirubin, Direct: 0.1 mg/dL (ref 0.1–0.3)
Total Bilirubin: 0.4 mg/dL (ref 0.2–1.2)
Total Protein: 7.8 g/dL (ref 6.0–8.3)

## 2024-09-18 LAB — BASIC METABOLIC PANEL WITH GFR
BUN: 17 mg/dL (ref 6–23)
CO2: 32 meq/L (ref 19–32)
Calcium: 10.1 mg/dL (ref 8.4–10.5)
Chloride: 102 meq/L (ref 96–112)
Creatinine, Ser: 1.02 mg/dL (ref 0.40–1.20)
GFR: 67.55 mL/min
Glucose, Bld: 81 mg/dL (ref 70–99)
Potassium: 4.7 meq/L (ref 3.5–5.1)
Sodium: 141 meq/L (ref 135–145)

## 2024-09-18 LAB — BETA HCG QUANT (REF LAB): hCG Quant: 5 m[IU]/mL

## 2024-09-18 LAB — TSH: TSH: 1.38 u[IU]/mL (ref 0.35–5.50)

## 2024-09-18 LAB — ESTRADIOL: Estradiol: <30 pg/mL
